# Patient Record
Sex: Female | Born: 1940 | Race: White | Hispanic: No | Smoking: Never smoker
Health system: Southern US, Community
[De-identification: ages and names within clinical notes are randomized; demographics above are authoritative.]

## PROBLEM LIST (undated history)

## (undated) DIAGNOSIS — J309 Allergic rhinitis, unspecified: Secondary | ICD-10-CM

## (undated) DIAGNOSIS — K259 Gastric ulcer, unspecified as acute or chronic, without hemorrhage or perforation: Secondary | ICD-10-CM

## (undated) DIAGNOSIS — G2581 Restless legs syndrome: Secondary | ICD-10-CM

## (undated) DIAGNOSIS — G4730 Sleep apnea, unspecified: Secondary | ICD-10-CM

## (undated) DIAGNOSIS — K219 Gastro-esophageal reflux disease without esophagitis: Secondary | ICD-10-CM

## (undated) DIAGNOSIS — C55 Malignant neoplasm of uterus, part unspecified: Secondary | ICD-10-CM

## (undated) DIAGNOSIS — K644 Residual hemorrhoidal skin tags: Secondary | ICD-10-CM

## (undated) DIAGNOSIS — R600 Localized edema: Secondary | ICD-10-CM

## (undated) DIAGNOSIS — M1990 Unspecified osteoarthritis, unspecified site: Secondary | ICD-10-CM

## (undated) DIAGNOSIS — D649 Anemia, unspecified: Secondary | ICD-10-CM

## (undated) DIAGNOSIS — K317 Polyp of stomach and duodenum: Secondary | ICD-10-CM

## (undated) DIAGNOSIS — M5430 Sciatica, unspecified side: Secondary | ICD-10-CM

## (undated) DIAGNOSIS — E782 Mixed hyperlipidemia: Secondary | ICD-10-CM

## (undated) DIAGNOSIS — M170 Bilateral primary osteoarthritis of knee: Secondary | ICD-10-CM

## (undated) DIAGNOSIS — K2970 Gastritis, unspecified, without bleeding: Secondary | ICD-10-CM

## (undated) DIAGNOSIS — C569 Malignant neoplasm of unspecified ovary: Secondary | ICD-10-CM

## (undated) DIAGNOSIS — I10 Essential (primary) hypertension: Secondary | ICD-10-CM

## (undated) HISTORY — PX: ABDOMINAL HYSTERECTOMY: SHX81

## (undated) HISTORY — PX: HIATAL HERNIA REPAIR: SHX195

## (undated) HISTORY — PX: EYE SURGERY: SHX253

## (undated) HISTORY — PX: DILATION AND CURETTAGE, DIAGNOSTIC / THERAPEUTIC: SUR384

## (undated) HISTORY — PX: CHOLECYSTECTOMY: SHX55

## (undated) HISTORY — PX: TONSILLECTOMY: SUR1361

---

## 2014-08-19 ENCOUNTER — Emergency Department: Payer: Self-pay | Admitting: Emergency Medicine

## 2014-10-19 ENCOUNTER — Ambulatory Visit: Payer: Self-pay | Admitting: Internal Medicine

## 2014-12-21 ENCOUNTER — Ambulatory Visit: Payer: Medicare Other | Admitting: Anesthesiology

## 2014-12-21 ENCOUNTER — Ambulatory Visit
Admission: RE | Admit: 2014-12-21 | Discharge: 2014-12-21 | Disposition: A | Discharge status: Home / Self Care | Payer: Medicare Other | Source: Ambulatory Visit | Attending: Gastroenterology | Admitting: Gastroenterology

## 2014-12-21 ENCOUNTER — Encounter: Payer: Self-pay | Admitting: *Deleted

## 2014-12-21 ENCOUNTER — Encounter
Admission: RE | Disposition: A | Discharge status: Home / Self Care | Payer: Self-pay | Source: Ambulatory Visit | Attending: Gastroenterology

## 2014-12-21 DIAGNOSIS — D122 Benign neoplasm of ascending colon: Secondary | ICD-10-CM | POA: Diagnosis not present

## 2014-12-21 DIAGNOSIS — K449 Diaphragmatic hernia without obstruction or gangrene: Secondary | ICD-10-CM | POA: Diagnosis not present

## 2014-12-21 DIAGNOSIS — K259 Gastric ulcer, unspecified as acute or chronic, without hemorrhage or perforation: Secondary | ICD-10-CM | POA: Insufficient documentation

## 2014-12-21 DIAGNOSIS — K625 Hemorrhage of anus and rectum: Secondary | ICD-10-CM | POA: Insufficient documentation

## 2014-12-21 DIAGNOSIS — Z86010 Personal history of colonic polyps: Secondary | ICD-10-CM | POA: Insufficient documentation

## 2014-12-21 DIAGNOSIS — D509 Iron deficiency anemia, unspecified: Secondary | ICD-10-CM | POA: Insufficient documentation

## 2014-12-21 DIAGNOSIS — D131 Benign neoplasm of stomach: Secondary | ICD-10-CM | POA: Diagnosis not present

## 2014-12-21 DIAGNOSIS — K640 First degree hemorrhoids: Secondary | ICD-10-CM | POA: Insufficient documentation

## 2014-12-21 HISTORY — DX: Essential (primary) hypertension: I10

## 2014-12-21 HISTORY — PX: ESOPHAGOGASTRODUODENOSCOPY: SHX5428

## 2014-12-21 HISTORY — PX: COLONOSCOPY: SHX5424

## 2014-12-21 SURGERY — EGD (ESOPHAGOGASTRODUODENOSCOPY)
Anesthesia: Monitor Anesthesia Care

## 2014-12-21 MED ORDER — SODIUM CHLORIDE 0.9 % IV SOLN
INTRAVENOUS | Status: DC
  Administered 2014-12-21: 10:00:00 via INTRAVENOUS

## 2014-12-21 MED ORDER — LIDOCAINE HCL (CARDIAC) 20 MG/ML IV SOLN: INTRAVENOUS | Status: DC | PRN

## 2014-12-21 MED ORDER — MIDAZOLAM HCL 5 MG/5ML IJ SOLN
INTRAMUSCULAR | Status: DC | PRN
  Administered 2014-12-21: 1 mg via INTRAVENOUS

## 2014-12-21 MED ORDER — MIDAZOLAM HCL 5 MG/5ML IJ SOLN: INTRAMUSCULAR | Status: DC | PRN

## 2014-12-21 MED ORDER — PROPOFOL 10 MG/ML IV BOLUS
INTRAVENOUS | Status: DC | PRN
  Administered 2014-12-21: 80 mg via INTRAVENOUS

## 2014-12-21 MED ORDER — FENTANYL CITRATE (PF) 100 MCG/2ML IJ SOLN
INTRAMUSCULAR | Status: DC | PRN
  Administered 2014-12-21: 1 ug via INTRAVENOUS

## 2014-12-21 MED ORDER — LIDOCAINE HCL (CARDIAC) 20 MG/ML IV SOLN
INTRAVENOUS | Status: DC | PRN
  Administered 2014-12-21: 80 mg via INTRAVENOUS

## 2014-12-21 MED ORDER — SODIUM CHLORIDE 0.9 % IV SOLN
INTRAVENOUS | Status: DC
Start: 2014-12-21 — End: 2014-12-21
  Administered 2014-12-21: 10:00:00 via INTRAVENOUS

## 2014-12-21 MED ORDER — PROPOFOL INFUSION 10 MG/ML OPTIME
INTRAVENOUS | Status: DC | PRN
  Administered 2014-12-21: 180 ug/kg/min via INTRAVENOUS

## 2014-12-21 NOTE — Anesthesia Preprocedure Evaluation (Addendum)
Anesthesia Evaluation  Patient identified by MRN, date of birth, ID band Patient awake    Reviewed: Allergy & Precautions, NPO status , Patient's Chart, lab work & pertinent test results, reviewed documented beta blocker date and time   Airway Mallampati: II  TM Distance: <3 FB Neck ROM: Full    Dental  (+) Upper Dentures, Partial Lower   Pulmonary neg pulmonary ROS,  breath sounds clear to auscultation  Pulmonary exam normal       Cardiovascular hypertension, Pt. on medications and Pt. on home beta blockers Rhythm:Regular Rate:Normal     Neuro/Psych negative neurological ROS  negative psych ROS   GI/Hepatic negative GI ROS, Neg liver ROS, Screening colonoscopy   Endo/Other  negative endocrine ROSMorbid obesity  Renal/GU negative Renal ROS  negative genitourinary   Musculoskeletal negative musculoskeletal ROS (+)   Abdominal (+) + obese,   Peds  Hematology  (+) anemia ,   Anesthesia Other Findings   Reproductive/Obstetrics negative OB ROS                            Anesthesia Physical Anesthesia Plan  ASA: III  Anesthesia Plan: MAC and General   Post-op Pain Management:    Induction: Intravenous  Airway Management Planned: Nasal Cannula  Additional Equipment:   Intra-op Plan:   Post-operative Plan:   Informed Consent: I have reviewed the patients History and Physical, chart, labs and discussed the procedure including the risks, benefits and alternatives for the proposed anesthesia with the patient or authorized representative who has indicated his/her understanding and acceptance.     Plan Discussed with: CRNA and Surgeon  Anesthesia Plan Comments:         Anesthesia Quick Evaluation

## 2014-12-21 NOTE — H&P (Signed)
The recent H&P (dated 12/21/14) was reviewed, the patient was examined and there is no change in the patients condition since that H&P was completed.   Sandra Hubbard, [place]  12/21/2014, 9:32 AM

## 2014-12-21 NOTE — Op Note (Signed)
[facility] Gastroenterology Patient Name: Sandra Hubbard Procedure Date: 12/21/2014 9:17 AM MRN: [id number] Account #: [id number] Date of Birth: [birth year] Admit Type: Outpatient Age: 74 Room: [facility] ENDO ROOM 3 Gender: Female Note Status: Finalized Procedure:         Colonoscopy Indications:       Iron deficiency anemia, personal hx tubal adenoma, virtual                     colonoscopy 6 years ago. Patient Profile:   This is a 74 year old female. Providers:         Gerrit Heck. Rayann Heman, MD Referring MD:      Glendon Axe (Referring MD) Medicines:         Propofol per Anesthesia Complications:     No immediate complications. Procedure:         Pre-Anesthesia Assessment:                    - See the other procedure note for documentation of the                     pre-procedure assessment.                    After obtaining informed consent, the colonoscope was                     passed under direct vision. Throughout the procedure, the                     patient's blood pressure, pulse, and oxygen saturations                     were monitored continuously. The Colonoscope was                     introduced through the anus and advanced to the the                     terminal ileum. The colonoscopy was performed without                     difficulty. The patient tolerated the procedure well. The                     quality of the bowel preparation was good. Findings:      The perianal exam findings include non-thrombosed external hemorrhoids.      A 5 mm polyp was found in the ascending colon. The polyp was sessile.       The polyp was removed with a cold snare. Resection and retrieval were       complete.      Internal hemorrhoids were found during retroflexion. The hemorrhoids       were Grade I (internal hemorrhoids that do not prolapse). Impression:        - Non-thrombosed external hemorrhoids found on perianal                     exam.                    -  One 5 mm polyp in the ascending colon. Resected and                     retrieved.                    -  Internal hemorrhoids. Recommendation:    - Observe patient in GI recovery unit.                    - Continue present medications.                    - Await pathology results.                    - Repeat colonoscopy for surveillance based on pathology                     results, no later than 5 years.                    - Return to referring physician.                    - The findings and recommendations were discussed with the                     patient.                    - The findings and recommendations were discussed with the                     patient's family. Procedure Code(s): --- Professional ---                    [phone], Colonoscopy, flexible; with removal of tumor(s),                     polyp(s), or other lesion(s) by snare technique CPT copyright 2014 American Medical Association. All rights reserved. The codes documented in this report are preliminary and upon coder review may  be revised to meet current compliance requirements. Mellody Life, MD 12/21/2014 10:45:08 AM This report has been signed electronically. Number of Addenda: 0 Note Initiated On: 12/21/2014 9:17 AM Scope Withdrawal Time: 0 hours 18 minutes 2 seconds  Total Procedure Duration: 0 hours 22 minutes 39 seconds       [facility]

## 2014-12-21 NOTE — Op Note (Signed)
[facility] Gastroenterology Patient Name: Eliannah Hinde Procedure Date: 12/21/2014 9:18 AM MRN: [id number] Account #: [id number] Date of Birth: [birth year] Admit Type: Outpatient Age: 74 Room: [facility] ENDO ROOM 3 Gender: Female Note Status: Finalized Procedure:         Upper GI endoscopy Indications:       Iron deficiency anemia Patient Profile:   This is a 74 year old female. Providers:         Gerrit Heck. Rayann Heman, MD Referring MD:      Glendon Axe (Referring MD) Medicines:         Propofol per Anesthesia Complications:     No immediate complications. Procedure:         Pre-Anesthesia Assessment:                    - Prior to the procedure, a History and Physical was                     performed, and patient medications and allergies were                     reviewed. The patient is competent. The risks and benefits                     of the procedure and the sedation options and risks were                     discussed with the patient. All questions were answered                     and informed consent was obtained. Patient identification                     and proposed procedure were verified by the physician and                     the nurse in the pre-procedure area. Mental Status                     Examination: alert and oriented. Airway Examination:                     normal oropharyngeal airway and neck mobility. Respiratory                     Examination: clear to auscultation. CV Examination: RRR,                     no murmurs, no S3 or S4. Prophylactic Antibiotics: The                     patient does not require prophylactic antibiotics. Prior                     Anticoagulants: The patient has taken no previous                     anticoagulant or antiplatelet agents. ASA Grade                     Assessment: III - A patient with severe systemic disease.                     After reviewing the risks and benefits, the patient was  deemed in satisfactory condition to undergo the procedure.                     The anesthesia plan was to use monitored anesthesia care                     (MAC). Immediately prior to administration of medications,                     the patient was re-assessed for adequacy to receive                     sedatives. The heart rate, respiratory rate, oxygen                     saturations, blood pressure, adequacy of pulmonary                     ventilation, and response to care were monitored                     throughout the procedure. The physical status of the                     patient was re-assessed after the procedure.                    - Prior to the procedure, a History and Physical was                     performed, and patient medications, allergies and                     sensitivities were reviewed. The patient's tolerance of                     previous anesthesia was reviewed.                    After obtaining informed consent, the endoscope was passed                     under direct vision. Throughout the procedure, the                     patient's blood pressure, pulse, and oxygen saturations                     were monitored continuously. The Olympus GIF-160 endoscope                     (S#. [id number]) was introduced through the mouth, and                     advanced to the second part of duodenum. The upper GI                     endoscopy was accomplished without difficulty. The patient                     tolerated the procedure well. Findings:      A medium-sized hiatus hernia was present.      A single 4 mm polyp was found at the gastroesophageal junction. Biopsies       were taken with a cold forceps for histology.      Two non-bleeding superficial  gastric ulcers were found in the gastric       antrum. The largest lesion was 4 mm in largest dimension. Biopsies were       taken with a cold forceps for histology.      Diffuse mildly erythematous mucosa was  found in the second part of the       duodenum. Biopsies were taken with a cold forceps for histology.      Five biopsies were obtained with cold forceps for histology randomly in       the gastric body and in the gastric antrum. Impression:        - Medium-sized hiatus hernia.                    - Gastroesophageal junction polyp(s) were found. Biopsied.                    - Gastric ulcers with clean base. Biopsied.                    - Erythematous duodenopathy. Biopsied.                    - Five biopsies were obtained in the gastric body and in                     the gastric antrum. Recommendation:    - Perform a colonoscopy today.                    - Start prilosec 40 mg daily                    - Avoid NSAIDS                    - Treat h pylori if pos.                    - The findings and recommendations were discussed with the                     patient.                    - The findings and recommendations were discussed with the                     patient's family.                    - Await pathology results. Procedure Code(s): --- Professional ---                    [phone], Esophagogastroduodenoscopy, flexible, transoral;                     with biopsy, single or multiple CPT copyright 2014 American Medical Association. All rights reserved. The codes documented in this report are preliminary and upon coder review may  be revised to meet current compliance requirements. Mellody Life, MD 12/21/2014 10:14:46 AM This report has been signed electronically. Number of Addenda: 0 Note Initiated On: 12/21/2014 9:18 AM      [facility]

## 2014-12-21 NOTE — H&P (Signed)
  Primary Care Physician:  Glendon Axe, MD Primary Gastroenterologist:  Dr. Rayann Heman  Pre-Procedure History & Physical: HPI:  Sandra Hubbard is a 74 y.o. female is here for a screening colonoscopy.   Past Medical History  Diagnosis Date  . Hypertension     Past Surgical History  Procedure Laterality Date  . Abdominal hysterectomy      Prior to Admission medications   Medication Sig Start Date End Date Taking? Authorizing Provider  aspirin 81 MG EC tablet Take 81 mg by mouth daily. 11/05/14  Yes Historical Provider, MD  atenolol (TENORMIN) 50 MG tablet Take 50 mg by mouth daily. 10/14/14  Yes Historical Provider, MD  cholecalciferol (VITAMIN D) 400 UNITS TABS tablet Take 2,000 Units by mouth daily.   Yes Historical Provider, MD  co-enzyme Q-10 30 MG capsule Take 30 mg by mouth 2 (two) times daily.   Yes Historical Provider, MD  fluticasone (FLONASE) 50 MCG/ACT nasal spray Place 2 sprays into both nostrils daily. 12/02/14  Yes Historical Provider, MD  ibuprofen (ADVIL,MOTRIN) 600 MG tablet Take 600 mg by mouth 2 (two) times daily as needed. 11/05/14  Yes Historical Provider, MD  KLOR-CON 10 10 MEQ tablet Take 10 mEq by mouth daily. 10/14/14  Yes Historical Provider, MD  lisinopril-hydrochlorothiazide (PRINZIDE,ZESTORETIC) 20-12.5 MG per tablet Take 2 tablets by mouth daily. 10/14/14  Yes Historical Provider, MD  magnesium oxide (MAG-OX) 400 MG tablet Take 400 mg by mouth 2 (two) times daily.   Yes Historical Provider, MD  Multiple Vitamins-Minerals (MULTIVITAMIN WITH MINERALS) tablet Take 2 tablets by mouth every morning.   Yes Historical Provider, MD  Multiple Vitamins-Minerals (MULTIVITAMIN WITH MINERALS) tablet Take 1 tablet by mouth every evening.   Yes Historical Provider, MD  Omega-3 Fatty Acids (OMEGA III EPA+DHA PO) Take 1 tablet by mouth 2 (two) times daily.   Yes Historical Provider, MD  [place] Take 1 each by mouth once. 12/02/14  Yes Historical Provider, MD     Allergies as of 12/03/2014  . (Not on File)     Review of Systems: See HPI, otherwise negative ROS  Physical Exam: BP 197/75 mmHg  Pulse 73  Temp(Src) 98 F (36.7 C) (Tympanic)  Resp 18  Ht 5\' 2"  (1.575 m)  Wt 233 lb (105.688 kg)  BMI 42.61 kg/m2  SpO2 98% General:   Alert,  pleasant and cooperative in NAD Head:  Normocephalic and atraumatic. Neck:  Supple; no masses or thyromegaly. Lungs:  Clear throughout to auscultation.    Heart:  Regular rate and rhythm. Abdomen:  Soft, nontender and nondistended. Normal bowel sounds, without guarding, and without rebound.   Neurologic:  Alert and  oriented x4;  grossly normal neurologically.  Impression/Plan: PERL FOLMAR is now here to undergo a screening colonoscopy.  Risks, benefits, and alternatives regarding colonoscopy have been reviewed with the patient.  Questions have been answered.  All parties agreeable.

## 2014-12-21 NOTE — Transfer of Care (Signed)
Immediate Anesthesia Transfer of Care Note  Patient: Sandra Hubbard  Procedure(s) Performed: Procedure(s): ESOPHAGOGASTRODUODENOSCOPY (EGD) (N/A) COLONOSCOPY (N/A)  Patient Location: PACU  Anesthesia Type:General  Level of Consciousness: awake and alert   Airway & Oxygen Therapy: Patient Spontanous Breathing  Post-op Assessment: Report given to RN  Post vital signs: stable  Last Vitals:  Filed Vitals:   12/21/14 1048  BP: 114/63  Pulse: 60  Temp: 35.8 C  Resp: 12    Complications: No apparent anesthesia complications

## 2014-12-21 NOTE — Anesthesia Postprocedure Evaluation (Signed)
  Anesthesia Post-op Note  Patient: Sandra Hubbard  Procedure(s) Performed: Procedure(s): ESOPHAGOGASTRODUODENOSCOPY (EGD) (N/A) COLONOSCOPY (N/A)  Anesthesia type:MAC, General  Patient location: PACU  Post pain: Pain level controlled  Post assessment: Post-op Vital signs reviewed, Patient's Cardiovascular Status Stable, Respiratory Function Stable, Patent Airway and No signs of Nausea or vomiting  Post vital signs: Reviewed and stable  Last Vitals:  Filed Vitals:   12/21/14 1100  BP: 113/58  Pulse: 65  Temp:   Resp: 14    Level of consciousness: awake, alert  and patient cooperative  Complications: No apparent anesthesia complications

## 2014-12-22 ENCOUNTER — Encounter: Payer: Self-pay | Admitting: *Deleted

## 2014-12-22 LAB — SURGICAL PATHOLOGY

## 2014-12-24 ENCOUNTER — Encounter: Payer: Self-pay | Admitting: Gastroenterology

## 2015-01-25 ENCOUNTER — Encounter: Payer: Self-pay | Admitting: *Deleted

## 2015-01-27 ENCOUNTER — Ambulatory Visit: Payer: Medicare Other | Admitting: Anesthesiology

## 2015-01-27 ENCOUNTER — Ambulatory Visit
Admission: RE | Admit: 2015-01-27 | Discharge: 2015-01-27 | Disposition: A | Discharge status: Home / Self Care | Payer: Medicare Other | Source: Ambulatory Visit | Attending: Gastroenterology | Admitting: Gastroenterology

## 2015-01-27 ENCOUNTER — Encounter
Admission: RE | Disposition: A | Discharge status: Home / Self Care | Payer: Self-pay | Source: Ambulatory Visit | Attending: Gastroenterology

## 2015-01-27 DIAGNOSIS — Z8542 Personal history of malignant neoplasm of other parts of uterus: Secondary | ICD-10-CM | POA: Insufficient documentation

## 2015-01-27 DIAGNOSIS — K2970 Gastritis, unspecified, without bleeding: Secondary | ICD-10-CM | POA: Insufficient documentation

## 2015-01-27 DIAGNOSIS — K449 Diaphragmatic hernia without obstruction or gangrene: Secondary | ICD-10-CM | POA: Diagnosis not present

## 2015-01-27 DIAGNOSIS — M170 Bilateral primary osteoarthritis of knee: Secondary | ICD-10-CM | POA: Insufficient documentation

## 2015-01-27 DIAGNOSIS — Z8543 Personal history of malignant neoplasm of ovary: Secondary | ICD-10-CM | POA: Insufficient documentation

## 2015-01-27 DIAGNOSIS — K259 Gastric ulcer, unspecified as acute or chronic, without hemorrhage or perforation: Secondary | ICD-10-CM | POA: Diagnosis not present

## 2015-01-27 DIAGNOSIS — Z79899 Other long term (current) drug therapy: Secondary | ICD-10-CM | POA: Insufficient documentation

## 2015-01-27 DIAGNOSIS — I10 Essential (primary) hypertension: Secondary | ICD-10-CM | POA: Diagnosis not present

## 2015-01-27 DIAGNOSIS — E782 Mixed hyperlipidemia: Secondary | ICD-10-CM | POA: Insufficient documentation

## 2015-01-27 DIAGNOSIS — D649 Anemia, unspecified: Secondary | ICD-10-CM | POA: Diagnosis not present

## 2015-01-27 DIAGNOSIS — Z90710 Acquired absence of both cervix and uterus: Secondary | ICD-10-CM | POA: Diagnosis not present

## 2015-01-27 DIAGNOSIS — K317 Polyp of stomach and duodenum: Secondary | ICD-10-CM | POA: Insufficient documentation

## 2015-01-27 DIAGNOSIS — M1990 Unspecified osteoarthritis, unspecified site: Secondary | ICD-10-CM | POA: Diagnosis not present

## 2015-01-27 HISTORY — DX: Anemia, unspecified: D64.9

## 2015-01-27 HISTORY — DX: Mixed hyperlipidemia: E78.2

## 2015-01-27 HISTORY — DX: Bilateral primary osteoarthritis of knee: M17.0

## 2015-01-27 HISTORY — DX: Malignant neoplasm of uterus, part unspecified: C55

## 2015-01-27 HISTORY — PX: ESOPHAGOGASTRODUODENOSCOPY: SHX5428

## 2015-01-27 HISTORY — DX: Allergic rhinitis, unspecified: J30.9

## 2015-01-27 HISTORY — DX: Malignant neoplasm of unspecified ovary: C56.9

## 2015-01-27 SURGERY — EGD (ESOPHAGOGASTRODUODENOSCOPY)
Anesthesia: General

## 2015-01-27 MED ORDER — LIDOCAINE HCL (CARDIAC) 20 MG/ML IV SOLN
INTRAVENOUS | Status: DC | PRN
  Administered 2015-01-27: 30 mg via INTRAVENOUS

## 2015-01-27 MED ORDER — MIDAZOLAM HCL 5 MG/5ML IJ SOLN
INTRAMUSCULAR | Status: DC | PRN
  Administered 2015-01-27: 1 mg via INTRAVENOUS

## 2015-01-27 MED ORDER — FENTANYL CITRATE (PF) 100 MCG/2ML IJ SOLN
INTRAMUSCULAR | Status: DC | PRN
  Administered 2015-01-27: 50 ug via INTRAVENOUS

## 2015-01-27 MED ORDER — PROPOFOL INFUSION 10 MG/ML OPTIME
INTRAVENOUS | Status: DC | PRN
  Administered 2015-01-27: 140 ug/kg/min via INTRAVENOUS

## 2015-01-27 MED ORDER — SODIUM CHLORIDE 0.9 % IV SOLN
INTRAVENOUS | Status: DC
  Administered 2015-01-27: 08:00:00 via INTRAVENOUS

## 2015-01-27 MED ORDER — SODIUM CHLORIDE 0.9 % IV SOLN
INTRAVENOUS | Status: DC
  Administered 2015-01-27: 1000 mL via INTRAVENOUS

## 2015-01-27 MED ORDER — PROPOFOL 10 MG/ML IV BOLUS
INTRAVENOUS | Status: DC | PRN
  Administered 2015-01-27: 50 mg via INTRAVENOUS

## 2015-01-27 NOTE — Op Note (Signed)
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 Providers:         Gerrit Heck. Rayann Heman, MD Referring MD:      Glendon Axe (Referring MD) Medicines:         Propofol per Anesthesia Complications:     No immediate complications. Procedure:         Pre-Anesthesia Assessment:                    - Prior to the procedure, a History and Physical was                     performed, and patient medications and allergies were                     reviewed. The patient is competent. The risks and benefits                     of the procedure and the sedation options and risks were                     discussed with the patient. All questions were answered                     and informed consent was obtained. Patient identification                     and proposed procedure were verified by the physician and                     the nurse in the pre-procedure area. Mental Status                     Examination: alert and oriented. Airway Examination:                     normal oropharyngeal airway and neck mobility. Respiratory                     Examination: clear to auscultation. CV Examination: RRR,                     no murmurs, no S3 or S4. Prophylactic Antibiotics: The                     patient does not require prophylactic antibiotics. Prior                     Anticoagulants: The patient has taken no previous                     anticoagulant or antiplatelet agents. ASA Grade  Assessment: III - A  patient with severe systemic disease.                     After reviewing the risks and benefits, the patient was                     deemed in satisfactory condition to undergo the procedure.                     The anesthesia plan was to use monitored anesthesia care                     (MAC). Immediately prior to administration of medications,                     the patient was re-assessed for adequacy to receive                     sedatives. The heart rate, respiratory rate, oxygen                     saturations, blood pressure, adequacy of pulmonary                     ventilation, and response to care were monitored                     throughout the procedure. The physical status of the                     patient was re-assessed after the procedure.                    - Prior to the procedure, a History and Physical was                     performed, and patient medications, allergies and                     sensitivities were reviewed. The patient's tolerance of                     previous anesthesia was reviewed.                    After obtaining informed consent, the endoscope was passed                     under direct vision. Throughout the procedure, the                     patient's blood pressure, pulse, and oxygen saturations                     were monitored continuously. The Olympus GIF-160 endoscope                     (S#. [id number]) was introduced through the mouth, and                     advanced to the second part of duodenum. The upper GI                     endoscopy was accomplished without difficulty. The patient  tolerated the procedure well. Findings:      A single 3 mm polyp was found at the gastroesophageal junction. The       polyp was removed with a cold biopsy forceps. Resection and retrieval       were complete.      A small hiatus hernia was present.      Localized mild inflammation characterized by erythema was found in the        gastric antrum.      A single 4 mm sessile polyp was found in the second part of the       duodenum. The polyp was removed with a cold biopsy forceps. Resection       and retrieval were complete. Impression:        - Gastroesophageal junction polyp(s) were found. Resected                     and retrieved.                    - Small hiatus hernia.                    - Gastritis. Ulcers are healed.                    - A single duodenal polyp. Resected and retrieved. Recommendation:    - Observe patient in GI recovery unit.                    - Resume regular diet.                    - Continue present medications.                    - Await pathology results.                    - Repeat the upper endoscopy in 2 years for surveillance                     to f/u squamous papilloma                    - Return to referring physician.                    - The findings and recommendations were discussed with the                     patient.                    - The findings and recommendations were discussed with the                     patient's family. Procedure Code(s): --- Professional ---                    [phone], Esophagogastroduodenoscopy, flexible, transoral;                     with biopsy, single or multiple CPT copyright 2014 American Medical Association. All rights reserved. The codes documented in this report are preliminary and upon coder review may  be revised to meet current compliance requirements. Mellody Life, MD 01/27/2015 8:41:52 AM This report has been signed electronically. Number of Addenda: 0 Note Initiated On: 01/27/2015 7:36  [facility]

## 2015-01-27 NOTE — Discharge Instructions (Signed)

## 2015-01-27 NOTE — Anesthesia Postprocedure Evaluation (Signed)
  Anesthesia Post-op Note  Patient: Sandra Hubbard  Procedure(s) Performed: Procedure(s): ESOPHAGOGASTRODUODENOSCOPY (EGD) (N/A)  Anesthesia type:General  Patient location: PACU  Post pain: Pain level controlled  Post assessment: Post-op Vital signs reviewed, Patient's Cardiovascular Status Stable, Respiratory Function Stable, Patent Airway and No signs of Nausea or vomiting  Post vital signs: Reviewed and stable  Last Vitals:  Filed Vitals:   01/27/15 0900  BP: 114/61  Pulse: 57  Temp:   Resp: 15    Level of consciousness: awake, alert  and patient cooperative  Complications: No apparent anesthesia complications

## 2015-01-27 NOTE — Transfer of Care (Signed)
Immediate Anesthesia Transfer of Care Note  Patient: Sandra Hubbard  Procedure(s) Performed: Procedure(s): ESOPHAGOGASTRODUODENOSCOPY (EGD) (N/A)  Patient Location: PACU  Anesthesia Type:General  Level of Consciousness: awake  Airway & Oxygen Therapy: Patient Spontanous Breathing  Post-op Assessment: Report given to RN  Post vital signs: Reviewed and stable  Last Vitals:  Filed Vitals:   01/27/15 0845  BP: 106/66  Pulse: 60  Temp: 36 C  Resp:     Complications: No apparent anesthesia complications

## 2015-01-27 NOTE — Anesthesia Postprocedure Evaluation (Addendum)
  Anesthesia Post-op Note  Patient: Sandra Hubbard  Procedure(s) Performed: Procedure(s): ESOPHAGOGASTRODUODENOSCOPY (EGD) (N/A)  Anesthesia type:General  Patient location: PACU  Post pain: Pain level controlled  Post assessment: Post-op Vital signs reviewed, Patient's Cardiovascular Status Stable, Respiratory Function Stable, Patent Airway and No signs of Nausea or vomiting  Post vital signs: Reviewed and stable  Last Vitals:  Filed Vitals:   01/27/15 0845  BP: 106/66  Pulse: 60  Temp: 36 C  Resp:     Level of consciousness: awake, alert  and patient cooperative  Complications: No apparent anesthesia complications

## 2015-01-27 NOTE — H&P (Signed)
Primary Care Physician:  Glendon Axe, MD  Pre-Procedure History & Physical: HPI:  Sandra Hubbard is a 75 y.o. female is here for an endoscopy.   Past Medical History  Diagnosis Date  . Hypertension   . Osteoarthritis of both knees   . Osteoarthritis of both knees   . Rhinitis, allergic   . Hyperlipidemia, mixed   . Chronic anemia   . Ovarian cancer   . Ovarian cancer   . Uterine cancer     Past Surgical History  Procedure Laterality Date  . Abdominal hysterectomy    . Esophagogastroduodenoscopy N/A 12/21/2014    Procedure: ESOPHAGOGASTRODUODENOSCOPY (EGD);  Surgeon: Josefine Class, MD;  Location: [facility] ENDOSCOPY;  Service: Endoscopy;  Laterality: N/A;  . Colonoscopy N/A 12/21/2014    Procedure: COLONOSCOPY;  Surgeon: Josefine Class, MD;  Location: [facility] ENDOSCOPY;  Service: Endoscopy;  Laterality: N/A;    Prior to Admission medications   Medication Sig Start Date End Date Taking? Authorizing Provider  acetaminophen (TYLENOL) 650 MG CR tablet Take 1,300 mg by mouth every 8 (eight) hours as needed for pain.   Yes Historical Provider, MD  aspirin 81 MG EC tablet Take 81 mg by mouth daily. 11/05/14  Yes Historical Provider, MD  atenolol (TENORMIN) 50 MG tablet Take 50 mg by mouth daily. 10/14/14  Yes Historical Provider, MD  cetirizine (ZYRTEC) 10 MG tablet Take 10 mg by mouth daily.   Yes Historical Provider, MD  Cholecalciferol (VITAMIN D) 2000 UNITS CAPS Take 1 capsule by mouth 2 (two) times daily.   Yes Historical Provider, MD  co-enzyme Q-10 30 MG capsule Take 30 mg by mouth daily.    Yes Historical Provider, MD  ferrous sulfate 325 (65 FE) MG tablet Take 325 mg by mouth daily with breakfast.   Yes Historical Provider, MD  fexofenadine (ALLEGRA) 180 MG tablet Take 180 mg by mouth daily.   Yes Historical Provider, MD  fluticasone (FLONASE) 50 MCG/ACT nasal spray Place 2 sprays into both nostrils daily. 12/02/14  Yes Historical Provider, MD  furosemide (LASIX) 40 MG tablet  Take 40 mg by mouth daily.   Yes Historical Provider, MD  glucosamine-chondroitin 500-400 MG tablet Take 2 tablets by mouth 2 (two) times daily.   Yes Historical Provider, MD  KLOR-CON 10 10 MEQ tablet Take 10 mEq by mouth daily. 10/14/14  Yes Historical Provider, MD  lisinopril-hydrochlorothiazide (PRINZIDE,ZESTORETIC) 20-12.5 MG per tablet Take 2 tablets by mouth daily. 10/14/14  Yes Historical Provider, MD  magnesium oxide (MAG-OX) 400 MG tablet Take 400 mg by mouth 2 (two) times daily.   Yes Historical Provider, MD  Misc Natural Products (LEG VEIN & CIRCULATION PO) Take 1 tablet by mouth 2 (two) times daily.   Yes Historical Provider, MD  Multiple Vitamins-Minerals (MULTIVITAMIN WITH MINERALS) tablet Take 2-3 tablets by mouth 2 (two) times daily. 3 tab in the morning and 2 in the afternoon   Yes Historical Provider, MD  Omega-3 Fatty Acids (OMEGA III EPA+DHA PO) Take 1 tablet by mouth 2 (two) times daily.   Yes Historical Provider, MD  omeprazole (PRILOSEC) 40 MG capsule Take 40 mg by mouth daily.   Yes Historical Provider, MD  potassium chloride (K-DUR,KLOR-CON) 10 MEQ tablet Take 10 mEq by mouth daily.   Yes Historical Provider, MD  [place] Take 1 each by mouth once. 12/02/14  Yes Historical Provider, MD    Allergies as of 01/12/2015 - Review Complete 12/21/2014  Allergen Reaction Noted  . Maxzide [hydrochlorothiazide  w-triamterene] Other (See Comments) 12/15/2014  . Donnatal [pb-hyoscy-atropine-scopolamine] Itching and Rash 12/21/2014    History reviewed. No pertinent family history.  History   Social History  . Marital Status: Widowed    Spouse Name: N/A  . Number of Children: N/A  . Years of Education: N/A   Occupational History  . Not on file.   Social History Main Topics  . Smoking status: Never Smoker   . Smokeless tobacco: Not on file  . Alcohol Use: No  . Drug Use: No  . Sexual Activity: Not on file   Other Topics Concern  . Not on file   Social  History Narrative     Physical Exam: BP 142/73 mmHg  Pulse 78  Temp(Src) 97.3 F (36.3 C) (Tympanic)  Resp 20  Ht 5\' 2"  (1.575 m)  Wt 235 lb (106.595 kg)  BMI 42.97 kg/m2  SpO2 98% General:   Alert,  pleasant and cooperative in NAD Head:  Normocephalic and atraumatic. Neck:  Supple; no masses or thyromegaly. Lungs:  Clear throughout to auscultation.    Heart:  Regular rate and rhythm. Abdomen:  Soft, nontender and nondistended. Normal bowel sounds, without guarding, and without rebound.   Neurologic:  Alert and  oriented x4;  grossly normal neurologically.  Impression/Plan: Sandra Hubbard is here for an endoscopy to be performed for removal of squamous papilloma, f/u gastric ulcers  Risks, benefits, limitations, and alternatives regarding  endoscopy have been reviewed with the patient.  Questions have been answered.  All parties agreeable.   Josefine Class, MD  01/27/2015, 8:15 AM

## 2015-01-27 NOTE — Anesthesia Preprocedure Evaluation (Addendum)
Anesthesia Evaluation  Patient identified by MRN, date of birth, ID band Patient awake    Reviewed: Allergy & Precautions, NPO status , Patient's Chart, lab work & pertinent test results  History of Anesthesia Complications Negative for: history of anesthetic complications  Airway Mallampati: II  TM Distance: >3 FB Neck ROM: Full    Dental  (+) Upper Dentures, Partial Lower   Pulmonary neg pulmonary ROS,  breath sounds clear to auscultation  Pulmonary exam normal       Cardiovascular Exercise Tolerance: Poor hypertension, Pt. on medications Normal cardiovascular examRhythm:Regular Rate:Normal     Neuro/Psych negative neurological ROS  negative psych ROS   GI/Hepatic negative GI ROS, Neg liver ROS,   Endo/Other  negative endocrine ROS  Renal/GU negative Renal ROS  negative genitourinary   Musculoskeletal  (+) Arthritis -,   Abdominal   Peds negative pediatric ROS (+)  Hematology  (+) anemia ,   Anesthesia Other Findings   Reproductive/Obstetrics negative OB ROS                            Anesthesia Physical Anesthesia Plan  ASA: III  Anesthesia Plan: General   Post-op Pain Management:    Induction: Intravenous  Airway Management Planned: Nasal Cannula  Additional Equipment:   Intra-op Plan:   Post-operative Plan:   Informed Consent: I have reviewed the patients History and Physical, chart, labs and discussed the procedure including the risks, benefits and alternatives for the proposed anesthesia with the patient or authorized representative who has indicated his/her understanding and acceptance.     Plan Discussed with: CRNA and Surgeon  Anesthesia Plan Comments:         Anesthesia Quick Evaluation

## 2015-01-28 ENCOUNTER — Encounter: Payer: Self-pay | Admitting: Gastroenterology

## 2015-02-03 LAB — SURGICAL PATHOLOGY

## 2016-10-01 ENCOUNTER — Other Ambulatory Visit: Payer: Self-pay | Admitting: Physical Medicine and Rehabilitation

## 2016-10-01 DIAGNOSIS — M5136 Other intervertebral disc degeneration, lumbar region: Secondary | ICD-10-CM

## 2016-10-10 ENCOUNTER — Ambulatory Visit
Admission: RE | Admit: 2016-10-10 | Discharge: 2016-10-10 | Disposition: A | Discharge status: Home / Self Care | Payer: Medicare Other | Source: Ambulatory Visit | Attending: Physical Medicine and Rehabilitation | Admitting: Physical Medicine and Rehabilitation

## 2016-10-10 DIAGNOSIS — M4306 Spondylolysis, lumbar region: Secondary | ICD-10-CM | POA: Insufficient documentation

## 2016-10-10 DIAGNOSIS — M5126 Other intervertebral disc displacement, lumbar region: Secondary | ICD-10-CM | POA: Insufficient documentation

## 2016-10-10 DIAGNOSIS — M4807 Spinal stenosis, lumbosacral region: Secondary | ICD-10-CM | POA: Diagnosis not present

## 2016-10-10 DIAGNOSIS — M2578 Osteophyte, vertebrae: Secondary | ICD-10-CM | POA: Diagnosis not present

## 2016-10-10 DIAGNOSIS — M5125 Other intervertebral disc displacement, thoracolumbar region: Secondary | ICD-10-CM | POA: Insufficient documentation

## 2016-10-10 DIAGNOSIS — M48061 Spinal stenosis, lumbar region without neurogenic claudication: Secondary | ICD-10-CM | POA: Diagnosis not present

## 2016-10-10 DIAGNOSIS — M5136 Other intervertebral disc degeneration, lumbar region: Secondary | ICD-10-CM | POA: Insufficient documentation

## 2016-10-27 DIAGNOSIS — M51369 Other intervertebral disc degeneration, lumbar region without mention of lumbar back pain or lower extremity pain: Secondary | ICD-10-CM | POA: Diagnosis present

## 2017-02-01 ENCOUNTER — Other Ambulatory Visit: Payer: Self-pay | Admitting: Internal Medicine

## 2017-02-01 DIAGNOSIS — Z1231 Encounter for screening mammogram for malignant neoplasm of breast: Secondary | ICD-10-CM

## 2017-02-12 ENCOUNTER — Ambulatory Visit
Admission: RE | Admit: 2017-02-12 | Discharge: 2017-02-12 | Disposition: A | Discharge status: Home / Self Care | Payer: Medicare Other | Source: Ambulatory Visit | Attending: Internal Medicine | Admitting: Internal Medicine

## 2017-02-12 DIAGNOSIS — Z1231 Encounter for screening mammogram for malignant neoplasm of breast: Secondary | ICD-10-CM | POA: Diagnosis not present

## 2017-02-25 ENCOUNTER — Other Ambulatory Visit: Payer: Self-pay | Admitting: Internal Medicine

## 2017-02-25 DIAGNOSIS — N6489 Other specified disorders of breast: Secondary | ICD-10-CM

## 2017-02-25 DIAGNOSIS — R928 Other abnormal and inconclusive findings on diagnostic imaging of breast: Secondary | ICD-10-CM

## 2017-02-28 ENCOUNTER — Ambulatory Visit
Admission: RE | Admit: 2017-02-28 | Discharge: 2017-02-28 | Disposition: A | Discharge status: Home / Self Care | Payer: Medicare Other | Source: Ambulatory Visit | Attending: Internal Medicine | Admitting: Internal Medicine

## 2017-02-28 DIAGNOSIS — R928 Other abnormal and inconclusive findings on diagnostic imaging of breast: Secondary | ICD-10-CM

## 2017-02-28 DIAGNOSIS — N6489 Other specified disorders of breast: Secondary | ICD-10-CM | POA: Insufficient documentation

## 2017-03-12 ENCOUNTER — Inpatient Hospital Stay
Admission: RE | Admit: 2017-03-12 | Discharge: 2017-03-12 | Disposition: A | Discharge status: Home / Self Care | Payer: Self-pay | Source: Ambulatory Visit | Attending: *Deleted | Admitting: *Deleted

## 2017-03-12 ENCOUNTER — Other Ambulatory Visit: Payer: Self-pay | Admitting: *Deleted

## 2017-03-12 DIAGNOSIS — Z9289 Personal history of other medical treatment: Secondary | ICD-10-CM

## 2017-03-26 ENCOUNTER — Other Ambulatory Visit: Payer: Self-pay | Admitting: Internal Medicine

## 2017-03-26 DIAGNOSIS — N6320 Unspecified lump in the left breast, unspecified quadrant: Secondary | ICD-10-CM

## 2017-03-26 DIAGNOSIS — R928 Other abnormal and inconclusive findings on diagnostic imaging of breast: Secondary | ICD-10-CM

## 2017-04-02 ENCOUNTER — Ambulatory Visit
Admission: RE | Admit: 2017-04-02 | Discharge: 2017-04-02 | Disposition: A | Discharge status: Home / Self Care | Payer: Medicare Other | Source: Ambulatory Visit | Attending: Internal Medicine | Admitting: Internal Medicine

## 2017-04-02 DIAGNOSIS — N6032 Fibrosclerosis of left breast: Secondary | ICD-10-CM | POA: Diagnosis not present

## 2017-04-02 DIAGNOSIS — R928 Other abnormal and inconclusive findings on diagnostic imaging of breast: Secondary | ICD-10-CM

## 2017-04-02 DIAGNOSIS — N6320 Unspecified lump in the left breast, unspecified quadrant: Secondary | ICD-10-CM | POA: Diagnosis present

## 2017-04-02 DIAGNOSIS — N6321 Unspecified lump in the left breast, upper outer quadrant: Secondary | ICD-10-CM | POA: Diagnosis not present

## 2017-04-02 HISTORY — PX: BREAST BIOPSY: SHX20

## 2017-04-03 LAB — SURGICAL PATHOLOGY

## 2017-06-06 ENCOUNTER — Encounter: Payer: Self-pay | Admitting: *Deleted

## 2017-06-07 ENCOUNTER — Ambulatory Visit: Payer: Medicare Other | Admitting: Anesthesiology

## 2017-06-07 ENCOUNTER — Ambulatory Visit
Admission: RE | Admit: 2017-06-07 | Discharge: 2017-06-07 | Disposition: A | Discharge status: Home / Self Care | Payer: Medicare Other | Source: Ambulatory Visit | Attending: Internal Medicine | Admitting: Internal Medicine

## 2017-06-07 ENCOUNTER — Encounter: Payer: Self-pay | Admitting: *Deleted

## 2017-06-07 ENCOUNTER — Encounter
Admission: RE | Disposition: A | Discharge status: Home / Self Care | Payer: Self-pay | Source: Ambulatory Visit | Attending: Internal Medicine

## 2017-06-07 DIAGNOSIS — G2581 Restless legs syndrome: Secondary | ICD-10-CM | POA: Diagnosis not present

## 2017-06-07 DIAGNOSIS — Z79899 Other long term (current) drug therapy: Secondary | ICD-10-CM | POA: Diagnosis not present

## 2017-06-07 DIAGNOSIS — Z7982 Long term (current) use of aspirin: Secondary | ICD-10-CM | POA: Insufficient documentation

## 2017-06-07 DIAGNOSIS — I10 Essential (primary) hypertension: Secondary | ICD-10-CM | POA: Diagnosis not present

## 2017-06-07 DIAGNOSIS — Z8542 Personal history of malignant neoplasm of other parts of uterus: Secondary | ICD-10-CM | POA: Insufficient documentation

## 2017-06-07 DIAGNOSIS — E669 Obesity, unspecified: Secondary | ICD-10-CM | POA: Diagnosis not present

## 2017-06-07 DIAGNOSIS — E782 Mixed hyperlipidemia: Secondary | ICD-10-CM | POA: Insufficient documentation

## 2017-06-07 DIAGNOSIS — Z6841 Body Mass Index (BMI) 40.0 and over, adult: Secondary | ICD-10-CM | POA: Diagnosis not present

## 2017-06-07 DIAGNOSIS — Z8543 Personal history of malignant neoplasm of ovary: Secondary | ICD-10-CM | POA: Diagnosis not present

## 2017-06-07 DIAGNOSIS — M170 Bilateral primary osteoarthritis of knee: Secondary | ICD-10-CM | POA: Insufficient documentation

## 2017-06-07 DIAGNOSIS — K3189 Other diseases of stomach and duodenum: Secondary | ICD-10-CM | POA: Diagnosis not present

## 2017-06-07 DIAGNOSIS — K219 Gastro-esophageal reflux disease without esophagitis: Secondary | ICD-10-CM | POA: Insufficient documentation

## 2017-06-07 DIAGNOSIS — Z888 Allergy status to other drugs, medicaments and biological substances status: Secondary | ICD-10-CM | POA: Insufficient documentation

## 2017-06-07 HISTORY — DX: Sciatica, unspecified side: M54.30

## 2017-06-07 HISTORY — PX: ESOPHAGOGASTRODUODENOSCOPY (EGD) WITH PROPOFOL: SHX5813

## 2017-06-07 HISTORY — DX: Unspecified osteoarthritis, unspecified site: M19.90

## 2017-06-07 HISTORY — DX: Restless legs syndrome: G25.81

## 2017-06-07 HISTORY — DX: Localized edema: R60.0

## 2017-06-07 SURGERY — ESOPHAGOGASTRODUODENOSCOPY (EGD) WITH PROPOFOL
Anesthesia: General

## 2017-06-07 MED ORDER — SODIUM CHLORIDE 0.9 % IV SOLN
INTRAVENOUS | Status: DC
  Administered 2017-06-07: 13:00:00 via INTRAVENOUS

## 2017-06-07 MED ORDER — LIDOCAINE HCL (PF) 2 % IJ SOLN
INTRAMUSCULAR | Status: DC | PRN
  Administered 2017-06-07: 50 mg via INTRADERMAL

## 2017-06-07 MED ORDER — PROPOFOL 10 MG/ML IV BOLUS
INTRAVENOUS | Status: DC | PRN
  Administered 2017-06-07: 60 mg via INTRAVENOUS
  Administered 2017-06-07: 20 mg via INTRAVENOUS

## 2017-06-07 MED ORDER — PROPOFOL 500 MG/50ML IV EMUL
INTRAVENOUS | Status: DC | PRN
  Administered 2017-06-07: 120 ug/kg/min via INTRAVENOUS

## 2017-06-07 MED ORDER — SODIUM CHLORIDE 0.9 % IV SOLN
INTRAVENOUS | Status: DC | PRN
  Administered 2017-06-07: 13:00:00 via INTRAVENOUS

## 2017-06-07 MED ORDER — PROPOFOL 10 MG/ML IV BOLUS
INTRAVENOUS | Status: AC
  Filled 2017-06-07: qty 20

## 2017-06-07 NOTE — Anesthesia Postprocedure Evaluation (Signed)
Anesthesia Post Note  Patient: Sandra Hubbard  Procedure(s) Performed: ESOPHAGOGASTRODUODENOSCOPY (EGD) WITH PROPOFOL (N/A )  Patient location during evaluation: Endoscopy Anesthesia Type: General Level of consciousness: awake and alert and oriented Pain management: pain level controlled Vital Signs Assessment: post-procedure vital signs reviewed and stable Respiratory status: spontaneous breathing, nonlabored ventilation and respiratory function stable Cardiovascular status: blood pressure returned to baseline and stable Postop Assessment: no signs of nausea or vomiting Anesthetic complications: no     Last Vitals:  Vitals:   06/07/17 1402 06/07/17 1412  BP: (!) 118/54 (!) 141/64  Pulse: 78 64  Resp: 20 18  Temp: (!) 36.2 C   SpO2: 100% 100%    Last Pain:  Vitals:   06/07/17 1402  TempSrc: Tympanic                 Adan Beal

## 2017-06-07 NOTE — Anesthesia Preprocedure Evaluation (Signed)
Anesthesia Evaluation  Patient identified by MRN, date of birth, ID band Patient awake    Reviewed: Allergy & Precautions, NPO status , Patient's Chart, lab work & pertinent test results  History of Anesthesia Complications Negative for: history of anesthetic complications  Airway Mallampati: II  TM Distance: >3 FB Neck ROM: Full    Dental  (+) Upper Dentures, Partial Lower   Pulmonary neg pulmonary ROS, neg sleep apnea, neg COPD,    breath sounds clear to auscultation- rhonchi (-) wheezing      Cardiovascular hypertension, Pt. on medications (-) CAD, (-) Past MI and (-) Cardiac Stents  Rhythm:Regular Rate:Normal - Systolic murmurs and - Diastolic murmurs    Neuro/Psych negative neurological ROS  negative psych ROS   GI/Hepatic negative GI ROS, Neg liver ROS,   Endo/Other  negative endocrine ROSneg diabetes  Renal/GU negative Renal ROS     Musculoskeletal  (+) Arthritis ,   Abdominal (+) + obese,   Peds  Hematology  (+) anemia ,   Anesthesia Other Findings Past Medical History: No date: Chronic anemia No date: DJD (degenerative joint disease) No date: Edema of both legs No date: Hyperlipidemia, mixed No date: Hypertension No date: Osteoarthritis of both knees No date: Osteoarthritis of both knees No date: Ovarian cancer (HCC) No date: Ovarian cancer (HCC) No date: Rhinitis, allergic No date: RLS (restless legs syndrome) No date: Sciatica No date: Uterine cancer (HCC) No date: Uterine cancer ([place])   Reproductive/Obstetrics                             Anesthesia Physical Anesthesia Plan  ASA: III  Anesthesia Plan: General   Post-op Pain Management:    Induction: Intravenous  PONV Risk Score and Plan: 2 and Propofol infusion  Airway Management Planned: Natural Airway  Additional Equipment:   Intra-op Plan:   Post-operative Plan:   Informed Consent: I have reviewed  the patients History and Physical, chart, labs and discussed the procedure including the risks, benefits and alternatives for the proposed anesthesia with the patient or authorized representative who has indicated his/her understanding and acceptance.   Dental advisory given  Plan Discussed with: CRNA and Anesthesiologist  Anesthesia Plan Comments:         Anesthesia Quick Evaluation

## 2017-06-07 NOTE — Transfer of Care (Signed)
Immediate Anesthesia Transfer of Care Note  Patient: Sandra Hubbard  Procedure(s) Performed: ESOPHAGOGASTRODUODENOSCOPY (EGD) WITH PROPOFOL (N/A )  Patient Location: Endoscopy Unit  Anesthesia Type:General  Level of Consciousness: awake and alert   Airway & Oxygen Therapy: Patient Spontanous Breathing  Post-op Assessment: Report given to RN and Post -op Vital signs reviewed and stable  Post vital signs: Reviewed and stable  Last Vitals:  Vitals:   06/07/17 1258  BP: 140/64  Pulse: 79  Resp: 16  Temp: 37.1 C  SpO2: 99%    Last Pain:  Vitals:   06/07/17 1258  TempSrc: Tympanic      Patients Stated Pain Goal: 0 (05/39/76 7341)  Complications: No apparent anesthesia complications

## 2017-06-07 NOTE — Op Note (Signed)
[facility] Gastroenterology Patient Name: Sandra Hubbard Procedure Date: 06/07/2017 1:42 PM MRN: [id number] Account #: [id number] Date of Birth: 1941/07/22 Admit Type: Outpatient Age: 76 Room: [facility] ENDO ROOM 4 Gender: Female Note Status: Finalized Procedure:            Upper GI endoscopy Indications:          Esophageal reflux, Follow-up of benign esophageal tumor Providers:            Benay Pike. Alice Reichert MD, MD Referring MD:         Glendon Axe (Referring MD) Medicines:            Propofol per Anesthesia Complications:        No immediate complications. Procedure:            Pre-Anesthesia Assessment:                       - The risks and benefits of the procedure and the                        sedation options and risks were discussed with the                        patient. All questions were answered and informed                        consent was obtained.                       - Patient identification and proposed procedure were                        verified prior to the procedure by the nurse. The                        procedure was verified in the endoscopy suite.                       - ASA Grade Assessment: III - A patient with severe                        systemic disease.                       - After reviewing the risks and benefits, the patient                        was deemed in satisfactory condition to undergo the                        procedure.                       After obtaining informed consent, the endoscope was                        passed under direct vision. Throughout the procedure,                        the patient's blood pressure, pulse, and oxygen  saturations were monitored continuously. The                        Colonoscope was introduced through the mouth, and                        advanced to the third part of duodenum. The upper GI                        endoscopy was accomplished without  difficulty. The                        patient tolerated the procedure well. Findings:      White nummular lesions were noted in the entire esophagus. 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 Estimated blood loss       was minimal. I was unable to identify the polyp noted on a past       endoscopy.      Localized mildly erythematous mucosa without bleeding was found in the       gastric antrum. No biopsies or other specimens were collected for this       exam.      The examined duodenum was normal. Impression:           - White nummular lesions in esophageal mucosa. Biopsied.                       - Erythematous mucosa in the antrum. No specimens                        collected.                       - Normal examined duodenum. Recommendation:       - Patient has a contact number available for                        emergencies. The signs and symptoms of potential                        delayed complications were discussed with the patient.                        Return to normal activities tomorrow. Written discharge                        instructions were provided to the patient.                       - Resume previous diet.                       - Continue present medications.                       - Await pathology results.                       - No repeat upper endoscopy.                       -  Return to GI office in 1 year.                       - The findings and recommendations were discussed with                        the patient.                       - The findings and recommendations were discussed with                        the designated responsible adult. Procedure Code(s):    --- Professional ---                       [phone], Esophagogastroduodenoscopy, flexible, transoral;                        with biopsy, single or multiple Diagnosis Code(s):    ---  Professional ---                       K22.8, Other specified diseases of esophagus                       K31.89, Other diseases of stomach and duodenum                       K21.9, Gastro-esophageal reflux disease without                        esophagitis                       D13.0, Benign neoplasm of esophagus CPT copyright 2016 American Medical Association. All rights reserved. The codes documented in this report are preliminary and upon coder review may  be revised to meet current compliance requirements. Efrain Sella MD, MD 06/07/2017 2:03:44 PM This report has been signed electronically. Number of Addenda: 0 Note Initiated On: 06/07/2017 1:42 PM      [facility]

## 2017-06-07 NOTE — Anesthesia Post-op Follow-up Note (Signed)
Anesthesia QCDR form completed.        

## 2017-06-07 NOTE — H&P (Signed)
Outpatient short stay form Pre-procedure 06/07/2017 1:34 PM Teodoro K. Alice Reichert, M.D.  Primary Physician: Glendon Axe, M.D.  Reason for visit:  Esophageal polyp, GERD  History of present illness:  Patient with well controlled GERD. Has hx of esophageal polyp, benign biopsy. Here for f/u of lesion. No dysphagia.    Current Facility-Administered Medications:  .  0.9 %  sodium chloride infusion, , Intravenous, Continuous, [place], Benay Pike, MD, Last Rate: 20 mL/hr at 06/07/17 1316  Facility-Administered Medications Ordered in Other Encounters:  .  0.9 %  sodium chloride infusion, , Intravenous, Continuous PRN, [place], Amy, MD  Prescriptions Prior to Admission  Medication Sig Dispense Refill Last Dose  . aspirin 81 MG EC tablet Take 81 mg by mouth daily.  3 06/06/2017 at Unknown time  . atenolol (TENORMIN) 50 MG tablet Take 50 mg by mouth daily.  3 06/06/2017 at Unknown time  . cetirizine (ZYRTEC) 10 MG tablet Take 10 mg by mouth daily.   06/06/2017 at Unknown time  . Cholecalciferol (VITAMIN D) 2000 UNITS CAPS Take 1 capsule by mouth 2 (two) times daily.   06/06/2017 at Unknown time  . co-enzyme Q-10 30 MG capsule Take 30 mg by mouth daily.    06/06/2017 at Unknown time  . ferrous sulfate 325 (65 FE) MG tablet Take 325 mg by mouth daily with breakfast.   06/06/2017 at Unknown time  . fexofenadine (ALLEGRA) 180 MG tablet Take 180 mg by mouth daily.   06/06/2017 at Unknown time  . fluticasone (FLONASE) 50 MCG/ACT nasal spray Place 2 sprays into both nostrils daily.  12 06/07/2017 at Unknown time  . furosemide (LASIX) 40 MG tablet Take 40 mg by mouth daily.   06/06/2017 at Unknown time  . KLOR-CON 10 10 MEQ tablet Take 10 mEq by mouth daily.  3 06/06/2017 at Unknown time  . lisinopril-hydrochlorothiazide (PRINZIDE,ZESTORETIC) 20-12.5 MG per tablet Take 2 tablets by mouth daily.  3 06/06/2017 at Unknown time  . Lorcaserin HCl 10 MG TABS Take 10 mg by mouth.     . magnesium oxide (MAG-OX)  400 MG tablet Take 400 mg by mouth 2 (two) times daily.   06/06/2017 at Unknown time  . Multiple Vitamins-Minerals (MULTIVITAMIN WITH MINERALS) tablet Take 2-3 tablets by mouth 2 (two) times daily. 3 tab in the morning and 2 in the afternoon   06/06/2017 at Unknown time  . Omega-3 Fatty Acids (OMEGA III EPA+DHA PO) Take 1 tablet by mouth 2 (two) times daily.   06/06/2017 at Unknown time  . omeprazole (PRILOSEC) 40 MG capsule Take 40 mg by mouth daily.   06/06/2017 at Unknown time  . traMADol (ULTRAM) 50 MG tablet Take by mouth every 6 (six) hours as needed.   06/06/2017 at Unknown time  . acetaminophen (TYLENOL) 650 MG CR tablet Take 1,300 mg by mouth every 8 (eight) hours as needed for pain.     Marland Kitchen glucosamine-chondroitin 500-400 MG tablet Take 2 tablets by mouth 2 (two) times daily.     . Misc Natural Products (LEG VEIN & CIRCULATION PO) Take 1 tablet by mouth 2 (two) times daily.     . potassium chloride (K-DUR,KLOR-CON) 10 MEQ tablet Take 10 mEq by mouth daily.   01/27/2015 at 0600  . SUPREP BOWEL PREP SOLN Take 1 each by mouth once.  0 12/21/2014 at 0315     Allergies  Allergen Reactions  . Maxzide [Hydrochlorothiazide W-Triamterene] Other (See Comments)    unk  . Donnatal [Pb-Hyoscy-Atropine-Scopolamine] Itching and Rash  Past Medical History:  Diagnosis Date  . Chronic anemia   . DJD (degenerative joint disease)   . Edema of both legs   . Hyperlipidemia, mixed   . Hypertension   . Osteoarthritis of both knees   . Osteoarthritis of both knees   . Ovarian cancer ([place])   . Ovarian cancer ([place])   . Rhinitis, allergic   . RLS (restless legs syndrome)   . Sciatica   . Uterine cancer ([place])   . Uterine cancer ([place])     Review of systems:      Physical Exam  General appearance: alert, cooperative and appears stated age Resp: clear to auscultation bilaterally Cardio: regular rate and rhythm, S1, S2 normal, no murmur, click, rub or gallop GI: soft, non-tender; bowel sounds  normal; no masses,  no organomegaly     Planned procedures: EGD. The patient understands the nature of the planned procedure, indications, risks, alternatives and potential complications including but not limited to bleeding, infection, perforation, damage to internal organs and possible oversedation/side effects from anesthesia. The patient agrees and gives consent to proceed.  Please refer to procedure notes for findings, recommendations and patient disposition/instructions.    Teodoro K. Alice Reichert, M.D. Gastroenterology 06/07/2017  1:34 PM

## 2017-06-11 ENCOUNTER — Encounter: Payer: Self-pay | Admitting: Internal Medicine

## 2017-06-11 LAB — SURGICAL PATHOLOGY

## 2017-12-24 ENCOUNTER — Encounter (INDEPENDENT_AMBULATORY_CARE_PROVIDER_SITE_OTHER): Payer: Self-pay | Admitting: Vascular Surgery

## 2017-12-24 ENCOUNTER — Ambulatory Visit (INDEPENDENT_AMBULATORY_CARE_PROVIDER_SITE_OTHER): Payer: Medicare Other | Admitting: Vascular Surgery

## 2017-12-24 VITALS — BP 151/73 | HR 60 | Resp 16 | Ht 61.5 in | Wt 247.0 lb

## 2017-12-24 DIAGNOSIS — E66813 Obesity, class 3: Secondary | ICD-10-CM | POA: Insufficient documentation

## 2017-12-24 DIAGNOSIS — R600 Localized edema: Secondary | ICD-10-CM

## 2017-12-24 DIAGNOSIS — I10 Essential (primary) hypertension: Secondary | ICD-10-CM

## 2017-12-24 NOTE — Progress Notes (Signed)
Subjective:    Patient ID: Sandra Hubbard, female    DOB: [birth year], 77 y.o.   MRN: [id number] Chief Complaint  Patient presents with  . New Patient (Initial Visit)    ref Candiss Norse for Lymphedema   Presents as a new patient referred by Dr. Candiss Norse for evaluation of bilateral lower extremity edema.  The patient endorses a history of "years" of experiencing swelling to the bilateral lower legs.  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  At this time, the patient does not engage in conservative therapy including wearing medical grade 1 compression socks and elevating her legs on a daily basis.  The patient has tried to wear compression socks in the past however has been unable to find the correct fitting size.  The patient notes that she bought a "pump" off of [place] which does not work.  The patient notes that her swelling is worse towards the end of the day.  The swelling is associated with some discomfort.  The patient notes that over the last 5 years, a reddish tent has developed to her bilateral shins.  The patient denies any claudication-like symptoms, rest pain or ulcer formation to the bilateral lower extremity.  The patient does have "spine issues".  The patient denies any recent surgery or trauma to the bilateral lower extremity.  The patient denies any fever, nausea or vomiting.  Review of Systems  Constitutional: Negative.   HENT: Negative.   Eyes: Negative.   Respiratory: Negative.   Cardiovascular: Positive for leg swelling.  Gastrointestinal: Negative.   Endocrine: Negative.   Genitourinary: Negative.   Musculoskeletal: Negative.   Skin: Negative.   Allergic/Immunologic: Negative.   Neurological: Negative.   Hematological: Negative.   Psychiatric/Behavioral: Negative.       Objective:   Physical Exam  Constitutional: She is oriented to person, place, and time. She appears well-developed and  well-nourished. No distress.  HENT:  Head: Normocephalic and atraumatic.  Right Ear: External ear normal.  Left Ear: External ear normal.  Eyes: Pupils are equal, round, and reactive to light. Conjunctivae and EOM are normal.  Neck: Normal range of motion.  Cardiovascular: Normal rate, regular rhythm, normal heart sounds and intact distal pulses.  Pulses:      Radial pulses are 2+ on the right side, and 2+ on the left side.  Hard to palpate pedal pulses due to edema and body habitus  Pulmonary/Chest: Effort normal and breath sounds normal.  Musculoskeletal: Normal range of motion. She exhibits edema (Moderate nonpitting edema noted bilaterally).  Neurological: She is alert and oriented to person, place, and time.  Skin: Skin is warm and dry. She is not diaphoretic.  Moderate stasis dermatitis noted to the bilateral shins.  There is the beginning of fibrosis noted bilaterally.  There is no active cellulitis or ulcerations.  Psychiatric: She has a normal mood and affect. Her behavior is normal. Judgment and thought content normal.  Vitals reviewed.  BP (!) 151/73 (BP Location: Right Arm)   Pulse 60   Resp 16   Ht 5' 1.5" (1.562 m)   Wt 247 lb (112 kg)   BMI 45.91 kg/m   Past Medical History:  Diagnosis Date  . Chronic anemia   . DJD (degenerative joint disease)   . Edema of both legs   . Hyperlipidemia, mixed   . Hypertension   . Osteoarthritis of both knees   .  Osteoarthritis of both knees   . Ovarian cancer ([place])   . Ovarian cancer ([place])   . Rhinitis, allergic   . RLS (restless legs syndrome)   . Sciatica   . Uterine cancer ([place])   . Uterine cancer [facility])    Social History   Socioeconomic History  . Marital status: Widowed    Spouse name: Not on file  . Number of children: Not on file  . Years of education: Not on file  . Highest education level: Not on file  Occupational History  . Not on file  Social Needs  . Financial resource strain: Not on file  . Food  insecurity:    Worry: Not on file    Inability: Not on file  . Transportation needs:    Medical: Not on file    Non-medical: Not on file  Tobacco Use  . Smoking status: Never Smoker  . Smokeless tobacco: Never Used  Substance and Sexual Activity  . Alcohol use: No  . Drug use: No  . Sexual activity: Not on file  Lifestyle  . Physical activity:    Days per week: Not on file    Minutes per session: Not on file  . Stress: Not on file  Relationships  . Social connections:    Talks on phone: Not on file    Gets together: Not on file    Attends religious service: Not on file    Active member of club or organization: Not on file    Attends meetings of clubs or organizations: Not on file    Relationship status: Not on file  . Intimate partner violence:    Fear of current or ex partner: Not on file    Emotionally abused: Not on file    Physically abused: Not on file    Forced sexual activity: Not on file  Other Topics Concern  . Not on file  Social History Narrative  . Not on file   Past Surgical History:  Procedure Laterality Date  . ABDOMINAL HYSTERECTOMY    . COLONOSCOPY N/A 12/21/2014   Procedure: COLONOSCOPY;  Surgeon: Josefine Class, MD;  Location: [facility] ENDOSCOPY;  Service: Endoscopy;  Laterality: N/A;  . DILATION AND CURETTAGE, DIAGNOSTIC / THERAPEUTIC    . ESOPHAGOGASTRODUODENOSCOPY N/A 12/21/2014   Procedure: ESOPHAGOGASTRODUODENOSCOPY (EGD);  Surgeon: Josefine Class, MD;  Location: [facility] ENDOSCOPY;  Service: Endoscopy;  Laterality: N/A;  . ESOPHAGOGASTRODUODENOSCOPY N/A 01/27/2015   Procedure: ESOPHAGOGASTRODUODENOSCOPY (EGD);  Surgeon: Josefine Class, MD;  Location: [facility] ENDOSCOPY;  Service: Endoscopy;  Laterality: N/A;  . ESOPHAGOGASTRODUODENOSCOPY (EGD) WITH PROPOFOL N/A 06/07/2017   Procedure: ESOPHAGOGASTRODUODENOSCOPY (EGD) WITH PROPOFOL;  Surgeon: Toledo, Benay Pike, MD;  Location: [facility] ENDOSCOPY;  Service: Gastroenterology;  Laterality: N/A;  .  TONSILLECTOMY     Family History  Problem Relation Age of Onset  . Breast cancer Neg Hx    Allergies  Allergen Reactions  . Maxzide [Hydrochlorothiazide W-Triamterene] Other (See Comments)    unk  . Donnatal [Pb-Hyoscy-Atropine-Scopolamine] Itching and Rash      Assessment & Plan:  Presents as a new patient referred by Dr. Candiss Norse for evaluation of bilateral lower extremity edema.  The patient endorses a history of "years" of experiencing swelling to the bilateral lower legs.  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  At this time, the patient does not engage in conservative therapy including wearing medical grade 1  compression socks and elevating her legs on a daily basis.  The patient has tried to wear compression socks in the past however has been unable to find the correct fitting size.  The patient notes that she bought a "pump" off of [place] which does not work.  The patient notes that her swelling is worse towards the end of the day.  The swelling is associated with some discomfort.  The patient notes that over the last 5 years, a reddish tent has developed to her bilateral shins.  The patient denies any claudication-like symptoms, rest pain or ulcer formation to the bilateral lower extremity.  The patient does have "spine issues".  The patient denies any recent surgery or trauma to the bilateral lower extremity.  The patient denies any fever, nausea or vomiting.  1. Bilateral lower extremity edema - New The patient was encouraged to wear graduated compression stockings (20-30 mmHg) on a daily basis. The patient was instructed to begin wearing the stockings first thing in the morning and removing them in the evening. The patient was instructed specifically not to sleep in the stockings. Prescription given. We discussed both conventional compression socks and if our wraps as an option. In addition, behavioral modification  including elevation during the day will be initiated. Anti-inflammatories for pain. The patient will follow up in one months to asses conservative management.  Information on compression stockings was given to the patient. The patient was instructed to call the office in the interim if any worsening edema or ulcerations to the legs, feet or toes occurs. The patient expresses their understanding.  - VAS [place] LOWER EXTREMITY VENOUS REFLUX; Future  2. Essential hypertension - Stable Encouraged good control as its slows the progression of atherosclerotic disease  3. Morbid obesity ([place]) - Stable This is a contributing factor to the patient's bilateral lower extremity edema This is followed by the patient's primary care physician The patient was encouraged to increase her activity level.  Current Outpatient Medications on File Prior to Visit  Medication Sig Dispense Refill  . acetaminophen (TYLENOL) 650 MG CR tablet Take 1,300 mg by mouth every 8 (eight) hours as needed for pain.    Marland Kitchen aspirin 81 MG EC tablet Take 81 mg by mouth daily.  3  . atenolol (TENORMIN) 50 MG tablet Take 50 mg by mouth daily.  3  . co-enzyme Q-10 30 MG capsule Take 30 mg by mouth daily.     . ferrous sulfate 325 (65 FE) MG tablet Take 325 mg by mouth daily with breakfast.    . fexofenadine (ALLEGRA) 180 MG tablet Take 180 mg by mouth daily.    . fluticasone (FLONASE) 50 MCG/ACT nasal spray Place 2 sprays into both nostrils daily.  12  . furosemide (LASIX) 40 MG tablet Take 40 mg by mouth daily.    Marland Kitchen glucosamine-chondroitin 500-400 MG tablet Take 2 tablets by mouth 2 (two) times daily.    Marland Kitchen KLOR-CON 10 10 MEQ tablet Take 10 mEq by mouth daily.  3  . lisinopril-hydrochlorothiazide (PRINZIDE,ZESTORETIC) 20-12.5 MG per tablet Take 2 tablets by mouth daily.  3  . Lorcaserin HCl 10 MG TABS Take 10 mg by mouth.    . magnesium oxide (MAG-OX) 400 MG tablet Take 400 mg by mouth 2 (two) times daily.    . Misc Natural Products  (LEG VEIN & CIRCULATION PO) Take 1 tablet by mouth 2 (two) times daily.    . Multiple Vitamins-Minerals (MULTIVITAMIN WITH MINERALS) tablet Take 2-3 tablets by  mouth 2 (two) times daily. 3 tab in the morning and 2 in the afternoon    . Omega-3 Fatty Acids (OMEGA III EPA+DHA PO) Take 1 tablet by mouth 2 (two) times daily.    Marland Kitchen omeprazole (PRILOSEC) 40 MG capsule Take 40 mg by mouth daily.    . traMADol (ULTRAM) 50 MG tablet Take by mouth every 6 (six) hours as needed.    . cetirizine (ZYRTEC) 10 MG tablet Take 10 mg by mouth daily.    . Cholecalciferol (VITAMIN D) 2000 UNITS CAPS Take 1 capsule by mouth 2 (two) times daily.    . potassium chloride (K-DUR,KLOR-CON) 10 MEQ tablet Take 10 mEq by mouth daily.    Manus Gunning BOWEL PREP SOLN Take 1 each by mouth once.  0   No current facility-administered medications on file prior to visit.    There are no Patient Instructions on file for this visit. No follow-ups on file.  Kinsey Karch A Babe Clenney, PA-C

## 2018-02-04 ENCOUNTER — Encounter (INDEPENDENT_AMBULATORY_CARE_PROVIDER_SITE_OTHER): Payer: TRICARE For Life (TFL)

## 2018-02-04 ENCOUNTER — Ambulatory Visit (INDEPENDENT_AMBULATORY_CARE_PROVIDER_SITE_OTHER): Payer: TRICARE For Life (TFL) | Admitting: Vascular Surgery

## 2018-03-04 IMAGING — US US BREAST*L* LIMITED INC AXILLA
1 series · 11 of 11 positions shown · non-contrast
Comparison: Screening mammograms dated 10/19/2014 and 02/12/2017

ADDENDUM:
The patient's outside prior mammograms from January 21, 2012,
01/02/2011, 11/15/2009, 11/03/2009, 10/13/2008, 08/12/2007,
07/30/2007, 09/23/2006, 03/26/2006, and 02/26/2006 have been
obtained and comparison made. The possible asymmetry identified on
screening mammogram within the subareolar left breast was likely
present on prior exams but is more prominent on the recent study.
Ultrasound-guided biopsy of the intraductal debris versus mass at 12
o'clock, 1 cm from the nipple and of the intraductal debris versus
mass at 1 o'clock, 1 cm from the nipple is recommended.
CLINICAL DATA: Callback from screening mammogram

EXAM:
2D DIGITAL DIAGNOSTIC LEFT MAMMOGRAM WITH CAD AND ADJUNCT TOMO
ULTRASOUND LEFT BREAST

[Series 1: us breast*left* limited inc axilla · 0.06mm/px · 11 of 11 slices shown]
[im 1/11]
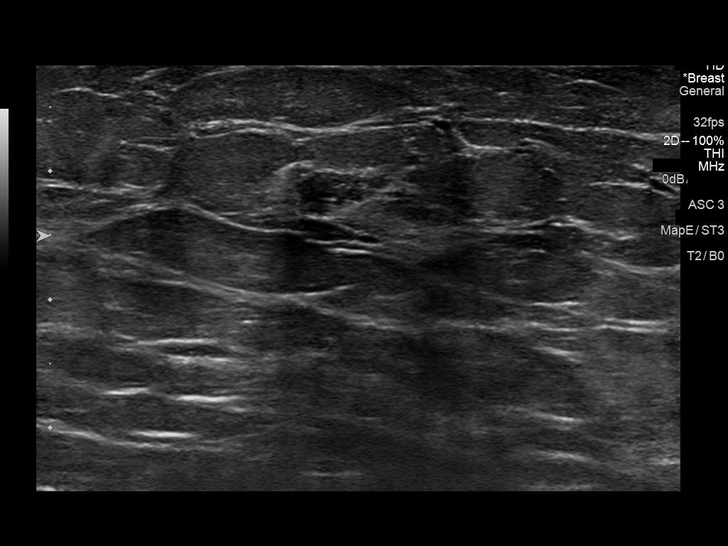
[im 2/11]
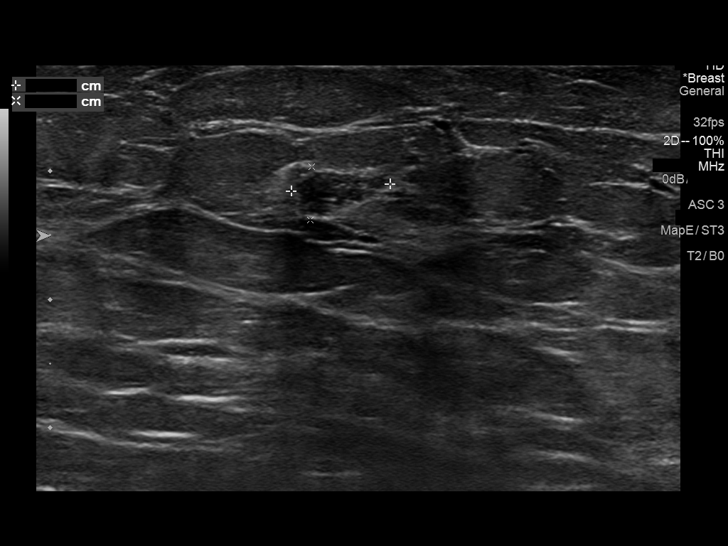
[im 3/11]
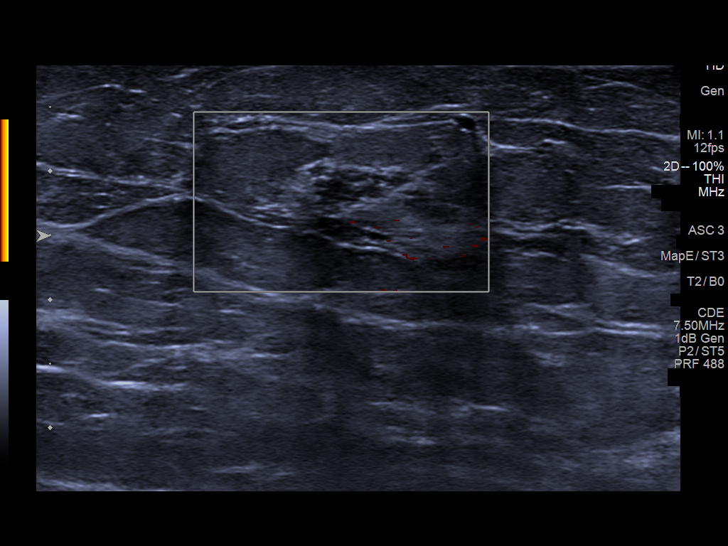
[im 4/11]
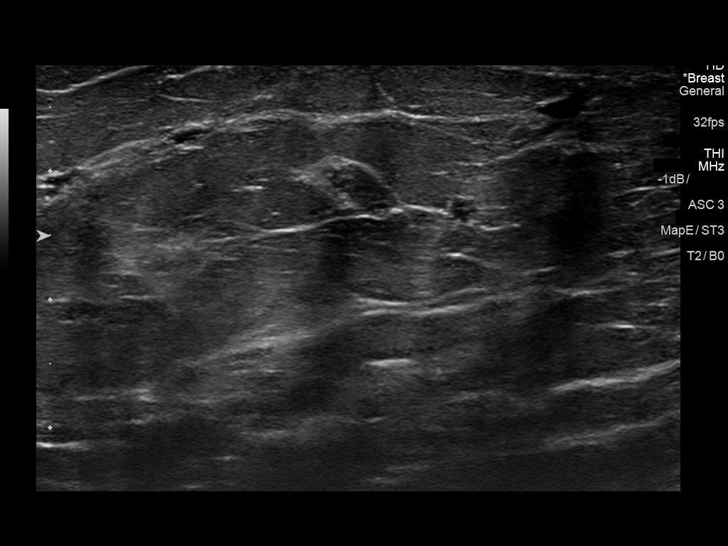
[im 5/11]
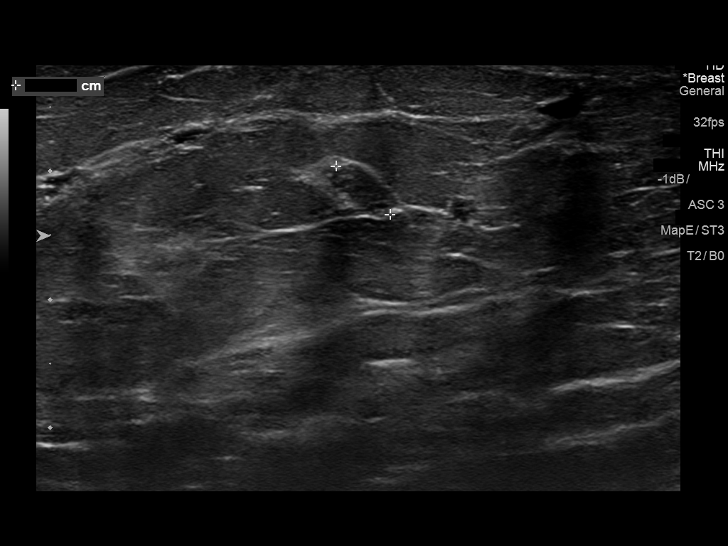
[im 6/11]
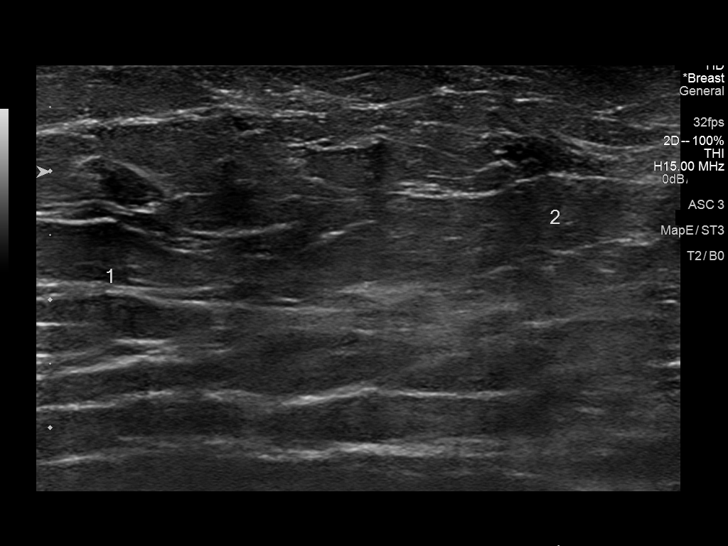
[im 7/11]
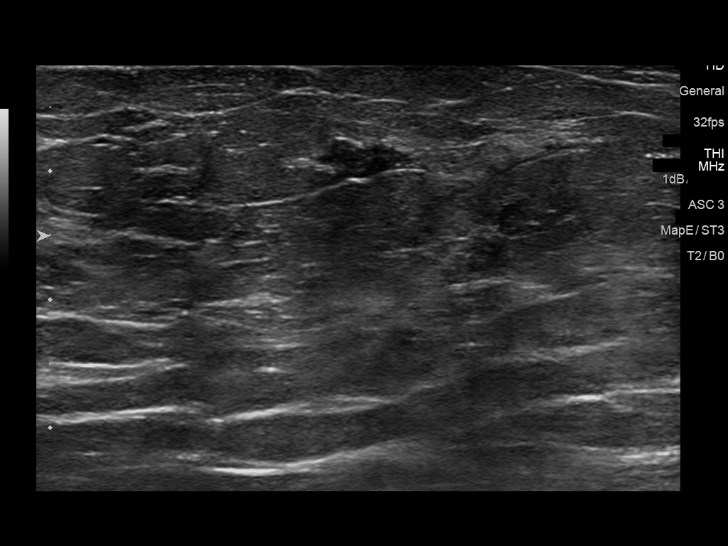
[im 8/11]
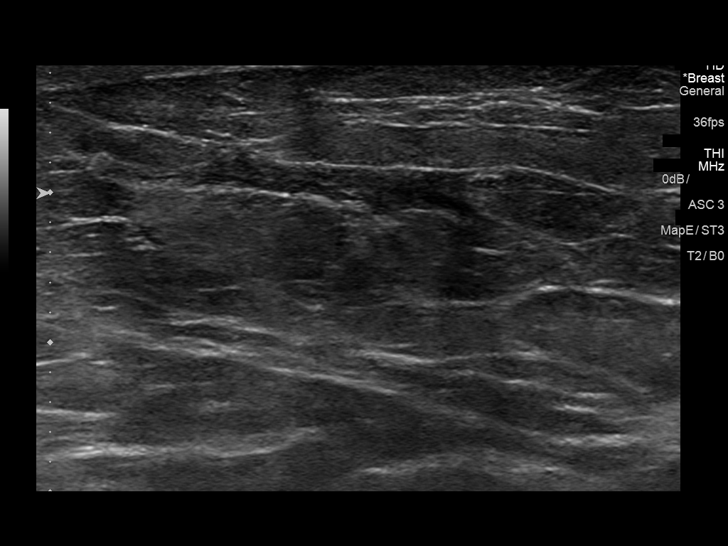
[im 9/11]
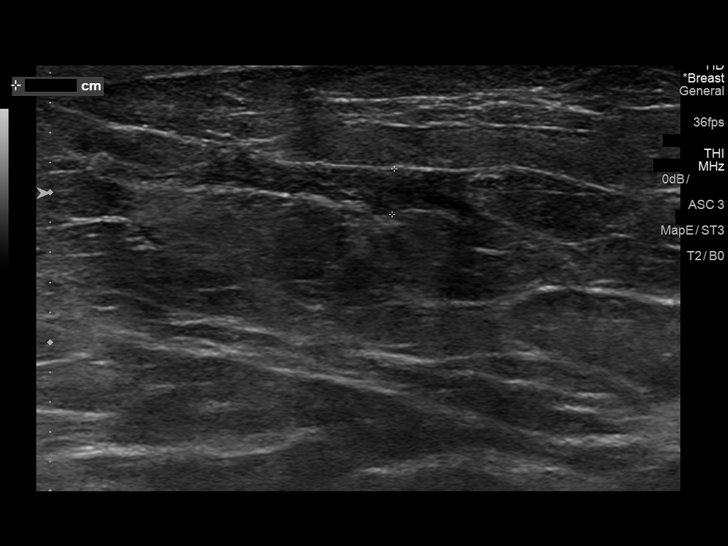
[im 10/11]
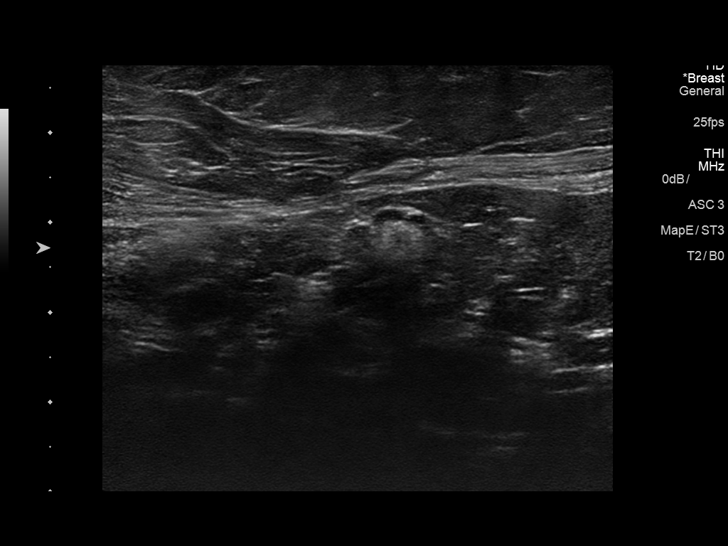
[im 11/11]
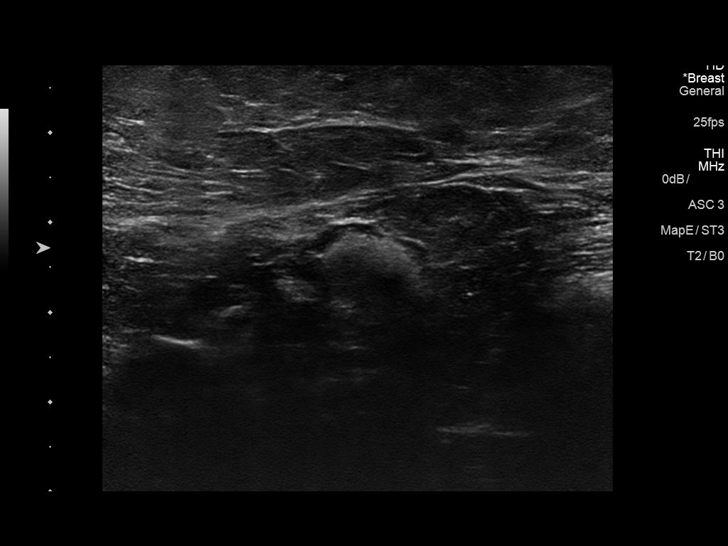

[11 of 11 positions shown; findings below may reference images not displayed]

IMPRESSION: 1. Intraductal debris versus mass at 12 o'clock, 1 cm from the
nipple.

2. Intraductal debris versus mass at 1 o'clock, 1 cm from the
nipple.

RECOMMENDATION:

Ultrasound-guided biopsy of the intraductal debris versus mass at 12
o'clock, 1 cm from the nipple and of the intraductal debris versus
mass at 1 o'clock, 1 cm from the nipple.

BI-RADS CATEGORY 4: Suspicious.
ACR Breast Density Category b: There are scattered areas of
fibroglandular density.
FINDINGS: The possible asymmetry identified within the subareolar left breast
persists on today's spot compression views.

Mammographic images were processed with CAD.

On physical exam, no discrete mass is felt in the area of concern
within the subareolar left breast. The patient denies any nipple
discharge.

Targeted ultrasound of the subareolar left breast was performed
demonstrating an 8 x 4 x 6 mm area of intraductal echogenic material
at 12 o'clock, 1 cm from the nipple. A similar-appearing area was
identified at 1 o'clock, 1 cm from the nipple measuring 6 x 3 x 3
mm. No internal vascularity was identified within either area.
IMPRESSION: Incomplete evaluation.

RECOMMENDATION:
Attempt will be made to obtain the patient's older outside prior
mammograms and comparison made. An addendum will be made at that
time.

I have discussed the findings and recommendations with the patient.
Results were also provided in writing at the conclusion of the
visit. If applicable, a reminder letter will be sent to the patient
regarding the next appointment.

BI-RADS CATEGORY  0: Incomplete. Need additional imaging evaluation
and/or prior mammograms for comparison.

## 2018-03-18 ENCOUNTER — Encounter

## 2018-03-18 ENCOUNTER — Encounter (INDEPENDENT_AMBULATORY_CARE_PROVIDER_SITE_OTHER): Payer: Self-pay | Admitting: Vascular Surgery

## 2018-03-18 ENCOUNTER — Ambulatory Visit (INDEPENDENT_AMBULATORY_CARE_PROVIDER_SITE_OTHER): Payer: Medicare Other | Admitting: Vascular Surgery

## 2018-03-18 ENCOUNTER — Ambulatory Visit (INDEPENDENT_AMBULATORY_CARE_PROVIDER_SITE_OTHER): Payer: Medicare Other

## 2018-03-18 VITALS — BP 135/69 | HR 58 | Resp 16 | Ht 61.5 in | Wt 235.8 lb

## 2018-03-18 DIAGNOSIS — I872 Venous insufficiency (chronic) (peripheral): Secondary | ICD-10-CM | POA: Diagnosis not present

## 2018-03-18 DIAGNOSIS — I890 Lymphedema, not elsewhere classified: Secondary | ICD-10-CM

## 2018-03-18 DIAGNOSIS — R600 Localized edema: Secondary | ICD-10-CM | POA: Diagnosis not present

## 2018-03-18 NOTE — Progress Notes (Signed)
Subjective:    Patient ID: Sandra Hubbard, female    DOB: 30-Apr-1941, 77 y.o.   MRN: [id number] Chief Complaint  Patient presents with  . Follow-up    pt conv bil ven reflux     HPI  Patient presents today for a 2 month follow-up for bilateral lower extremity swelling.  She underwent a lower extremity reflux study.  Since we last saw her she endorses using medical grade 1 compression socks on a regular basis, utilizing elevation on a daily basis, and remaining active.   Although she is engaging in conservative therapy,  some swelling and discomfort in the bilateral lower extremities still exists. She states these symptoms have begun to become life style limiting, as these symptoms have affected her ability to function on a daily basis.  She denies any new open wounds, weeping, or varicosities.  She also denies any nausea, vomiting, fever, or diarrhea.  The results of her bilateral lower venous reflux study revealed that in the right leg there were abnormal reflux times noted in the great saphenous vein at the proximal thigh however no evidence of DVT in the right leg there was also mild venous insufficiency at the proximal great saphenous vein as well.  In the left abnormal reflux times were noted to the great saphenous vein at the saphenofemoral junction in the great saphenous vein at the mid thigh there is no evidence of DVT in the left leg as well.    Constitutional: [] Weight loss  [] Fever  [] Chills Cardiac: [] Chest pain   [] Chest pressure   [] Palpitations   [] Shortness of breath when laying flat   [] Shortness of breath with exertion. Vascular:  [] Pain in legs with walking   [] Pain in legs with standing  [] History of DVT   [] Phlebitis   [x] Swelling in legs   [] Varicose veins   [] Non-healing ulcers Pulmonary:   [] Uses home oxygen   [] Productive cough   [] Hemoptysis   [] Wheeze  [] COPD   [] Asthma Neurologic:  [] Dizziness   [] Seizures   [] History of stroke   [] History of TIA  [] Aphasia    [] Vissual changes   [] Weakness or numbness in arm   [] Weakness or numbness in leg Musculoskeletal:   [] Joint swelling   [] Joint pain   [] Low back pain Hematologic:  [] Easy bruising  [] Easy bleeding   [] Hypercoagulable state   [] Anemic Gastrointestinal:  [] Diarrhea   [] Vomiting  [] Gastroesophageal reflux/heartburn   [] Difficulty swallowing. Genitourinary:  [] Chronic kidney disease   [] Difficult urination  [] Frequent urination   [] Blood in urine Skin:  [x] Rashes   [] Ulcers  Psychological:  [] History of anxiety   []  History of major depression.     Objective:   Physical Exam  BP 135/69 (BP Location: Right Arm)   Pulse (!) 58   Resp 16   Ht 5' 1.5" (1.562 m)   Wt 235 lb 12.8 oz (107 kg)   BMI 43.83 kg/m   Past Medical History:  Diagnosis Date  . Chronic anemia   . DJD (degenerative joint disease)   . Edema of both legs   . Hyperlipidemia, mixed   . Hypertension   . Osteoarthritis of both knees   . Osteoarthritis of both knees   . Ovarian cancer ([place])   . Ovarian cancer ([place])   . Rhinitis, allergic   . RLS (restless legs syndrome)   . Sciatica   . Uterine cancer ([place])   . Uterine cancer ([place])      Gen: WD/WN, NAD  Head: [place]/AT, No temporalis wasting.  Ear/Nose/Throat: Hearing grossly intact, nares w/o erythema or drainage, poor dentition Eyes: PER, EOMI, sclera nonicteric.  Neck: Supple, no masses.  No bruit or JVD.  Pulmonary:  Good air movement, clear to auscultation bilaterally, no use of accessory muscles.  Cardiac: RRR, normal S1, S2, no Murmurs. Vascular: Large varicosities non-present,.  Mild venous stasis dermatitis changes to the legs bilaterally.  2+ soft pitting edema Vessel Right Left  Radial    Brachial    Femoral    Popliteal    PT 2+ 2+  DP 1+ 1+  Carotid    Gastrointestinal: soft, non-distended. No guarding/no peritoneal signs.  Musculoskeletal: M/S 5/5 throughout.  No deformity or atrophy.  Neurologic: CN 2-12 intact. Pain and light touch intact in  extremities.  Symmetrical.  Speech is fluent. Motor exam as listed above. Psychiatric: Judgment intact, Mood & affect appropriate for pt's clinical situation. Dermatologic: Venous rashes no ulcers noted.  No changes consistent with cellulitis. Lymph : No Cervical lymphadenopathy,  Mild stasis dermatitis   Social History   Socioeconomic History  . Marital status: Widowed    Spouse name: Not on file  . Number of children: Not on file  . Years of education: Not on file  . Highest education level: Not on file  Occupational History  . Not on file  Social Needs  . Financial resource strain: Not on file  . Food insecurity:    Worry: Not on file    Inability: Not on file  . Transportation needs:    Medical: Not on file    Non-medical: Not on file  Tobacco Use  . Smoking status: Never Smoker  . Smokeless tobacco: Never Used  Substance and Sexual Activity  . Alcohol use: No  . Drug use: No  . Sexual activity: Not on file  Lifestyle  . Physical activity:    Days per week: Not on file    Minutes per session: Not on file  . Stress: Not on file  Relationships  . Social connections:    Talks on phone: Not on file    Gets together: Not on file    Attends religious service: Not on file    Active member of club or organization: Not on file    Attends meetings of clubs or organizations: Not on file    Relationship status: Not on file  . Intimate partner violence:    Fear of current or ex partner: Not on file    Emotionally abused: Not on file    Physically abused: Not on file    Forced sexual activity: Not on file  Other Topics Concern  . Not on file  Social History Narrative  . Not on file    Past Surgical History:  Procedure Laterality Date  . ABDOMINAL HYSTERECTOMY    . COLONOSCOPY N/A 12/21/2014   Procedure: COLONOSCOPY;  Surgeon: Josefine Class, MD;  Location: [facility] ENDOSCOPY;  Service: Endoscopy;  Laterality: N/A;  . DILATION AND CURETTAGE, DIAGNOSTIC / THERAPEUTIC      . ESOPHAGOGASTRODUODENOSCOPY N/A 12/21/2014   Procedure: ESOPHAGOGASTRODUODENOSCOPY (EGD);  Surgeon: Josefine Class, MD;  [facility] ENDOSCOPY;  Service: Endoscopy;  Laterality: N/A;  . ESOPHAGOGASTRODUODENOSCOPY N/A 01/27/2015   Procedure: ESOPHAGOGASTRODUODENOSCOPY (EGD);  Surgeon: Josefine Class, MD;  Location: [facility] ENDOSCOPY;  Service: Endoscopy;  Laterality: N/A;  . ESOPHAGOGASTRODUODENOSCOPY (EGD) WITH PROPOFOL N/A 06/07/2017   Procedure: ESOPHAGOGASTRODUODENOSCOPY (EGD) WITH PROPOFOL;  Surgeon: Toledo, Benay Pike, MD;  Location: [facility] ENDOSCOPY;  Service: Gastroenterology;  Laterality: N/A;  . TONSILLECTOMY      Family History  Problem Relation Age of Onset  . Breast cancer Neg Hx     Allergies  Allergen Reactions  . Maxzide [Hydrochlorothiazide W-Triamterene] Other (See Comments)    unk  . Donnatal [Pb-Hyoscy-Atropine-Scopolamine] Itching and Rash       Assessment & Plan:  Patient presents today for a 2 month follow-up for bilateral lower extremity swelling.  She underwent a lower extremity reflux study.  Since we last saw her she endorses using medical grade 1 compression socks on a regular basis, utilizing elevation on a daily basis, and remaining active.   Although she is engaging in conservative therapy,  some swelling and discomfort in the bilateral lower extremities still exists. She states these symptoms have begun to become life style limiting, as these symptoms have affected her ability to function on a daily basis.  She denies any new open wounds, weeping, or varicosities.  She also denies any nausea, vomiting, fever, or diarrhea.  The results of her bilateral lower venous reflux study revealed that in the right leg there were abnormal reflux times noted in the great saphenous vein at the proximal thigh however no evidence of DVT in the right leg there was also mild venous insufficiency at the proximal great saphenous vein as well.  In the left abnormal reflux  times were noted to the great saphenous vein at the saphenofemoral junction in the great saphenous vein at the mid thigh there is no evidence of DVT in the left leg as well.   1. Lymphedema-New Ms. Morton presents with lymphedema following venous reflux study.  She has changes consistent with 2+ pitting edema and dermal thickening.  She has been recommended conservative therapy which she has been following utilizing class I medical grade compression stockings, daily elevation, and exercise.  Her symptoms have been persisting since her last visit, which was over her 30 days.  Despite conservative treatments including exercise elevation and class I compression stockings the patient still presents with stage II lymphedema.  Recommended continued conservative therapy, as well as the addition of a lymphedema pump.  We will follow-up in 3 months to determine how conservative therapy and lymph pump therapy have been working. I will apply to patient's insurance for lymphedema pump.   2. Chronic Venous Insufficiency-New As above  2. Morbid obesity (HCC)-stable Patient's obesity is a contributory factor to her lymphedema.  The patient has made a personal commitment to lose 15 pounds in the next 3 months.  She will continue following up with her primary care physician to help guide her towards this goal.  Current Outpatient Medications on File Prior to Visit  Medication Sig Dispense Refill  . acetaminophen (TYLENOL) 650 MG CR tablet Take 1,300 mg by mouth every 8 (eight) hours as needed for pain.    Marland Kitchen aspirin 81 MG EC tablet Take 81 mg by mouth daily.  3  . atenolol (TENORMIN) 50 MG tablet Take 50 mg by mouth daily.  3  . cetirizine (ZYRTEC) 10 MG tablet Take 10 mg by mouth daily.    . Cholecalciferol (VITAMIN D) 2000 UNITS CAPS Take 1 capsule by mouth 2 (two) times daily.    Marland Kitchen co-enzyme Q-10 30 MG capsule Take 30 mg by mouth daily.     . ferrous sulfate 325 (65 FE) MG tablet Take 325 mg by mouth daily  with breakfast.    . fexofenadine (ALLEGRA) 180 MG tablet Take 180  mg by mouth daily.    . fluticasone (FLONASE) 50 MCG/ACT nasal spray Place 2 sprays into both nostrils daily.  12  . furosemide (LASIX) 40 MG tablet Take 40 mg by mouth daily.    Marland Kitchen glucosamine-chondroitin 500-400 MG tablet Take 2 tablets by mouth 2 (two) times daily.    Marland Kitchen KLOR-CON 10 10 MEQ tablet Take 10 mEq by mouth daily.  3  . lisinopril-hydrochlorothiazide (PRINZIDE,ZESTORETIC) 20-12.5 MG per tablet Take 2 tablets by mouth daily.  3  . Lorcaserin HCl 10 MG TABS Take 10 mg by mouth.    . magnesium oxide (MAG-OX) 400 MG tablet Take 400 mg by mouth 2 (two) times daily.    . Misc Natural Products (LEG VEIN & CIRCULATION PO) Take 1 tablet by mouth 2 (two) times daily.    . Multiple Vitamins-Minerals (MULTIVITAMIN WITH MINERALS) tablet Take 2-3 tablets by mouth 2 (two) times daily. 3 tab in the morning and 2 in the afternoon    . Omega-3 Fatty Acids (OMEGA III EPA+DHA PO) Take 1 tablet by mouth 2 (two) times daily.    Marland Kitchen omeprazole (PRILOSEC) 40 MG capsule Take 40 mg by mouth daily.    . potassium chloride (K-DUR,KLOR-CON) 10 MEQ tablet Take 10 mEq by mouth daily.    Manus Gunning BOWEL PREP SOLN Take 1 each by mouth once.  0  . traMADol (ULTRAM) 50 MG tablet Take by mouth every 6 (six) hours as needed.     No current facility-administered medications on file prior to visit.       There are no Patient Instructions on file for this visit. Return in about 3 months (around 06/18/2018).   Kris Hartmann, NP

## 2018-05-28 ENCOUNTER — Ambulatory Visit (INDEPENDENT_AMBULATORY_CARE_PROVIDER_SITE_OTHER): Payer: TRICARE For Life (TFL) | Admitting: Vascular Surgery

## 2018-05-28 ENCOUNTER — Encounter (INDEPENDENT_AMBULATORY_CARE_PROVIDER_SITE_OTHER): Payer: Medicare Other

## 2018-06-18 ENCOUNTER — Encounter (INDEPENDENT_AMBULATORY_CARE_PROVIDER_SITE_OTHER): Payer: Self-pay | Admitting: Vascular Surgery

## 2018-06-18 ENCOUNTER — Ambulatory Visit (INDEPENDENT_AMBULATORY_CARE_PROVIDER_SITE_OTHER): Payer: Medicare Other | Admitting: Vascular Surgery

## 2018-06-18 VITALS — BP 133/65 | HR 63 | Resp 16 | Ht 61.5 in | Wt 233.8 lb

## 2018-06-18 DIAGNOSIS — I872 Venous insufficiency (chronic) (peripheral): Secondary | ICD-10-CM | POA: Diagnosis not present

## 2018-06-18 DIAGNOSIS — I890 Lymphedema, not elsewhere classified: Secondary | ICD-10-CM

## 2018-06-18 DIAGNOSIS — I10 Essential (primary) hypertension: Secondary | ICD-10-CM

## 2018-06-18 NOTE — Progress Notes (Signed)
Subjective:    Patient ID: Sandra Hubbard, female    DOB: April 30, 1941, 77 y.o.   MRN: [id number] Chief Complaint  Patient presents with  . Follow-up    49month follow up   Patient presents for a three-month lymphedema follow-up.  Since our last visit, the patient has been engaging in conservative therapy including wearing medical grade one compression socks, elevating her legs, remaining active and using her lymphedema pump at least one time a day for an hour towards the evening.  The patient has noted a market improvement in the swelling to her lower extremities.  The patient states that the skin on her bilateral legs has also improved.  The patient is very pleased with the improvement in her symptoms.  Patient is also changed her lifestyle and now is not prediabetic.  Patient denies any claudication-like symptoms, rest pain or ulcer formation to the bilateral lower extremity.  Patient denies any recurrent bouts of cellulitis.  Patient denies any fever, nausea vomiting.  Review of Systems  Constitutional: Negative.   HENT: Negative.   Eyes: Negative.   Respiratory: Negative.   Cardiovascular: Positive for leg swelling.  Gastrointestinal: Negative.   Endocrine: Negative.   Genitourinary: Negative.   Musculoskeletal: Negative.   Skin: Negative.   Allergic/Immunologic: Negative.   Neurological: Negative.   Hematological: Negative.   Psychiatric/Behavioral: Negative.       Objective:   Physical Exam  Constitutional: She is oriented to person, place, and time. She appears well-developed and well-nourished. No distress.  HENT:  Head: Normocephalic and atraumatic.  Right Ear: External ear normal.  Left Ear: External ear normal.  Eyes: Pupils are equal, round, and reactive to light. Conjunctivae and EOM are normal.  Neck: Normal range of motion.  Cardiovascular: Normal rate, regular rhythm, normal heart sounds and intact distal pulses.  Pulses:      Radial pulses are 2+ on the right  side, and 2+ on the left side.       Dorsalis pedis pulses are 1+ on the right side, and 1+ on the left side.       Posterior tibial pulses are 1+ on the right side, and 1+ on the left side.  Pulmonary/Chest: Effort normal and breath sounds normal.  Musculoskeletal: Normal range of motion. She exhibits edema (Minimal amount of edema noted to the bilateral legs.).  Neurological: She is alert and oriented to person, place, and time.  Skin: She is not diaphoretic.  Mild stasis dermatitis bilaterally.  There is no fibrosis, cellulitis, active ulcerations noted at this time.  Psychiatric: She has a normal mood and affect. Her behavior is normal. Judgment and thought content normal.  Vitals reviewed.  BP 133/65 (BP Location: Right Arm)   Pulse 63   Resp 16   Ht 5' 1.5" (1.562 m)   Wt 233 lb 12.8 oz (106.1 kg)   BMI 43.46 kg/m   Past Medical History:  Diagnosis Date  . Chronic anemia   . DJD (degenerative joint disease)   . Edema of both legs   . Hyperlipidemia, mixed   . Hypertension   . Osteoarthritis of both knees   . Osteoarthritis of both knees   . Ovarian cancer ([place])   . Ovarian cancer ([place])   . Rhinitis, allergic   . RLS (restless legs syndrome)   . Sciatica   . Uterine cancer ([place])   . Uterine cancer [facility])    Social History   Socioeconomic History  . Marital status: Widowed  Spouse name: Not on file  . Number of children: Not on file  . Years of education: Not on file  . Highest education level: Not on file  Occupational History  . Not on file  Social Needs  . Financial resource strain: Not on file  . Food insecurity:    Worry: Not on file    Inability: Not on file  . Transportation needs:    Medical: Not on file    Non-medical: Not on file  Tobacco Use  . Smoking status: Never Smoker  . Smokeless tobacco: Never Used  Substance and Sexual Activity  . Alcohol use: No  . Drug use: No  . Sexual activity: Not on file  Lifestyle  . Physical activity:     Days per week: Not on file    Minutes per session: Not on file  . Stress: Not on file  Relationships  . Social connections:    Talks on phone: Not on file    Gets together: Not on file    Attends religious service: Not on file    Active member of club or organization: Not on file    Attends meetings of clubs or organizations: Not on file    Relationship status: Not on file  . Intimate partner violence:    Fear of current or ex partner: Not on file    Emotionally abused: Not on file    Physically abused: Not on file    Forced sexual activity: Not on file  Other Topics Concern  . Not on file  Social History Narrative  . Not on file   Past Surgical History:  Procedure Laterality Date  . ABDOMINAL HYSTERECTOMY    . COLONOSCOPY N/A 12/21/2014   Procedure: COLONOSCOPY;  Surgeon: Josefine Class, MD;  Location: [facility] ENDOSCOPY;  Service: Endoscopy;  Laterality: N/A;  . DILATION AND CURETTAGE, DIAGNOSTIC / THERAPEUTIC    . ESOPHAGOGASTRODUODENOSCOPY N/A 12/21/2014   Procedure: ESOPHAGOGASTRODUODENOSCOPY (EGD);  Surgeon: Josefine Class, MD;  Location[facility] ENDOSCOPY;  Service: Endoscopy;  Laterality: N/A;  . ESOPHAGOGASTRODUODENOSCOPY N/A 01/27/2015   Procedure: ESOPHAGOGASTRODUODENOSCOPY (EGD);  Surgeon: Josefine Class, MD;  Location: [facility] ENDOSCOPY;  Service: Endoscopy;  Laterality: N/A;  . ESOPHAGOGASTRODUODENOSCOPY (EGD) WITH PROPOFOL N/A 06/07/2017   Procedure: ESOPHAGOGASTRODUODENOSCOPY (EGD) WITH PROPOFOL;  Surgeon: Toledo, Benay Pike, MD;  Location: [facility] ENDOSCOPY;  Service: Gastroenterology;  Laterality: N/A;  . TONSILLECTOMY     Family History  Problem Relation Age of Onset  . Breast cancer Neg Hx    Allergies  Allergen Reactions  . Maxzide [Hydrochlorothiazide W-Triamterene] Other (See Comments)    unk  . Donnatal [Pb-Hyoscy-Atropine-Scopolamine] Itching and Rash      Assessment & Plan:  Patient presents for a three-month lymphedema follow-up.  Since our last  visit, the patient has been engaging in conservative therapy including wearing medical grade one compression socks, elevating her legs, remaining active and using her lymphedema pump at least one time a day for an hour towards the evening.  The patient has noted a market improvement in the swelling to her lower extremities.  The patient states that the skin on her bilateral legs has also improved.  The patient is very pleased with the improvement in her symptoms.  Patient is also changed her lifestyle and now is not prediabetic.  Patient denies any claudication-like symptoms, rest pain or ulcer formation to the bilateral lower extremity.  Patient denies any recurrent bouts of cellulitis.  Patient denies any fever, nausea vomiting.  1. Lymphedema - Stable Patient presents for a lower extremity edema follow-up At this time, the patient's daily regimen is wearing medical grade 1 compression socks, elevating her legs, remaining active and using her lymphedema pump at least once a day for an hour. The patient has noted a market improvement in her bilateral lower extremity edema and discomfort Patient is very pleased with this improvement The patient should continue engaging in her current daily regimen and is to follow-up in 1 year I will add a bilateral ABI with her one-year follow-up to surveilled her peripheral artery disease. The patient was instructed to call the office in the interim if any worsening edema or ulcerations to the legs, feet or toes occurs. The patient expresses their understanding.  - VAS [place] ABI WITH/WO TBI; Future  2. Chronic venous insufficiency - Stable As above  3. Essential hypertension - Stable Encouraged good control as its slows the progression of atherosclerotic disease  Current Outpatient Medications on File Prior to Visit  Medication Sig Dispense Refill  . acetaminophen (TYLENOL) 650 MG CR tablet Take 1,300 mg by mouth every 8 (eight) hours as needed for pain.    Marland Kitchen  aspirin 81 MG EC tablet Take 81 mg by mouth daily.  3  . atenolol (TENORMIN) 50 MG tablet Take 50 mg by mouth daily.  3  . cetirizine (ZYRTEC) 10 MG tablet Take 10 mg by mouth daily.    . Cholecalciferol (VITAMIN D) 2000 UNITS CAPS Take 1 capsule by mouth 2 (two) times daily.    Marland Kitchen co-enzyme Q-10 30 MG capsule Take 30 mg by mouth daily.     . ferrous sulfate 325 (65 FE) MG tablet Take 325 mg by mouth daily with breakfast.    . fexofenadine (ALLEGRA) 180 MG tablet Take 180 mg by mouth daily.    . fluticasone (FLONASE) 50 MCG/ACT nasal spray Place 2 sprays into both nostrils daily.  12  . furosemide (LASIX) 40 MG tablet Take 40 mg by mouth daily.    Marland Kitchen glucosamine-chondroitin 500-400 MG tablet Take 2 tablets by mouth 2 (two) times daily.    Marland Kitchen KLOR-CON 10 10 MEQ tablet Take 10 mEq by mouth daily.  3  . lisinopril-hydrochlorothiazide (PRINZIDE,ZESTORETIC) 20-12.5 MG per tablet Take 2 tablets by mouth daily.  3  . Lorcaserin HCl 10 MG TABS Take 10 mg by mouth.    . magnesium oxide (MAG-OX) 400 MG tablet Take 400 mg by mouth 2 (two) times daily.    . Misc Natural Products (LEG VEIN & CIRCULATION PO) Take 1 tablet by mouth 2 (two) times daily.    . Multiple Vitamins-Minerals (MULTIVITAMIN WITH MINERALS) tablet Take 2-3 tablets by mouth 2 (two) times daily. 3 tab in the morning and 2 in the afternoon    . Omega-3 Fatty Acids (OMEGA III EPA+DHA PO) Take 1 tablet by mouth 2 (two) times daily.    Marland Kitchen omeprazole (PRILOSEC) 40 MG capsule Take 40 mg by mouth daily.    . potassium chloride (K-DUR,KLOR-CON) 10 MEQ tablet Take 10 mEq by mouth daily.    Manus Gunning BOWEL PREP SOLN Take 1 each by mouth once.  0  . traMADol (ULTRAM) 50 MG tablet Take by mouth every 6 (six) hours as needed.     No current facility-administered medications on file prior to visit.    There are no Patient Instructions on file for this visit. No follow-ups on file.  Dasha Kawabata A Kameron Glazebrook, PA-C

## 2018-08-26 ENCOUNTER — Other Ambulatory Visit: Payer: Self-pay | Admitting: Internal Medicine

## 2018-08-26 DIAGNOSIS — N6320 Unspecified lump in the left breast, unspecified quadrant: Secondary | ICD-10-CM

## 2018-08-26 DIAGNOSIS — Z87898 Personal history of other specified conditions: Secondary | ICD-10-CM

## 2018-08-26 DIAGNOSIS — Z1231 Encounter for screening mammogram for malignant neoplasm of breast: Secondary | ICD-10-CM

## 2018-09-17 ENCOUNTER — Ambulatory Visit
Admission: RE | Admit: 2018-09-17 | Discharge: 2018-09-17 | Disposition: A | Discharge status: Home / Self Care | Payer: Medicare Other | Source: Ambulatory Visit | Attending: Internal Medicine | Admitting: Internal Medicine

## 2018-09-17 DIAGNOSIS — Z1231 Encounter for screening mammogram for malignant neoplasm of breast: Secondary | ICD-10-CM

## 2018-09-17 DIAGNOSIS — Z87898 Personal history of other specified conditions: Secondary | ICD-10-CM

## 2018-09-17 DIAGNOSIS — N6314 Unspecified lump in the right breast, lower inner quadrant: Secondary | ICD-10-CM | POA: Diagnosis not present

## 2018-09-17 DIAGNOSIS — N6320 Unspecified lump in the left breast, unspecified quadrant: Secondary | ICD-10-CM

## 2018-09-17 DIAGNOSIS — N6313 Unspecified lump in the right breast, lower outer quadrant: Secondary | ICD-10-CM | POA: Diagnosis not present

## 2018-09-18 ENCOUNTER — Other Ambulatory Visit: Payer: Self-pay | Admitting: Internal Medicine

## 2018-09-18 DIAGNOSIS — N6310 Unspecified lump in the right breast, unspecified quadrant: Secondary | ICD-10-CM

## 2018-09-18 DIAGNOSIS — R928 Other abnormal and inconclusive findings on diagnostic imaging of breast: Secondary | ICD-10-CM

## 2018-09-19 ENCOUNTER — Ambulatory Visit
Admission: RE | Admit: 2018-09-19 | Discharge: 2018-09-19 | Disposition: A | Discharge status: Home / Self Care | Payer: Medicare Other | Source: Ambulatory Visit | Attending: Internal Medicine | Admitting: Internal Medicine

## 2018-09-19 DIAGNOSIS — N6310 Unspecified lump in the right breast, unspecified quadrant: Secondary | ICD-10-CM | POA: Insufficient documentation

## 2018-09-19 DIAGNOSIS — R928 Other abnormal and inconclusive findings on diagnostic imaging of breast: Secondary | ICD-10-CM | POA: Diagnosis not present

## 2018-09-19 HISTORY — PX: BREAST BIOPSY: SHX20

## 2018-09-22 ENCOUNTER — Telehealth: Payer: Self-pay

## 2018-09-25 LAB — SURGICAL PATHOLOGY

## 2018-09-26 ENCOUNTER — Telehealth: Payer: Self-pay | Admitting: Genetic Counselor

## 2018-09-26 ENCOUNTER — Other Ambulatory Visit: Payer: Self-pay | Admitting: Genetic Counselor

## 2018-09-26 ENCOUNTER — Encounter: Payer: Self-pay | Admitting: Genetic Counselor

## 2018-09-26 DIAGNOSIS — C801 Malignant (primary) neoplasm, unspecified: Secondary | ICD-10-CM

## 2018-09-26 NOTE — Telephone Encounter (Signed)
I received a message from Tanya Nones that Dr. Lysle Pearl is referring Sandra Hubbard for genetic counseling due to a history of ovarian cancer and needing genetic testing coordinated due to recent abnormal findings on a breast biopsy.  I left her a message to call and schedule this telegenetics visit to be done by phone at her convenience.   Steele Berg, [place], [place] Genetic Counselor Phone: [phone]

## 2018-09-26 NOTE — Telephone Encounter (Signed)
Cancer Genetics            Telegenetics Initial Visit    Patient Name: Sandra Hubbard Patient DOB: [birth year] Patient Age: 78 y.o. Phone Call Date: 09/26/2018  Referring Provider: Benjamine Sprague , MD  Reason for Visit: Evaluate for hereditary susceptibility to cancer    Assessment and Plan:  . Sandra Hubbard's personal history is not very clear. She was not sure whether she had uterine or ovarian cancer. While her family history is not suggestive of a hereditary predisposition to cancer, one significant limitation is that her father had only one brother. This paucity of women makes risk assessment challenging.    . Testing is available to determine whether she has a pathogenic mutation that will impact her screening and risk-reduction for cancer. A negative result will be reassuring.  . Ms. Cost wished to pursue genetic testing, and will be called to schedule a blood draw at [facility]. schedule a blood draw. Her sample will be sent to [facility] for analysis. [facility]'s STAT breast panel was requested as it will impact surgical decisions. Results should be available in about 7-12 days. The 9 genes on this panel are ATM, BRCA1, BRCA2, CDH1, CHEK2, PALB2, PTEN, STK11, TP53. Once this test is complete, analysis of additional genes on a larger hereditary cancer panel will proceed. She will be called after each result is obtained.   Dr. Grayland Ormond was available for questions concerning this case. Total time spent by counseling by phone was approximately 20 minutes.   _____________________________________________________________________   History of Present Illness: Sandra Hubbard, a 78 y.o. female, was referred for genetic counseling to discuss the possibility of a hereditary predisposition to cancer and discuss whether genetic testing is warranted. This was a telegenetics visit via phone.  Sandra Hubbard reported that she had a TAH/BSO in 1999 and was incidentally  found to have "2 small tumors; one on the ovary and one in the uterus". She said she could not remember if this was called ovarian or uterine cancer or possibly both. She indicated that she did not get offered any other treatment like chemotherapy or radiation.  She recently had a breast biopsy. That report was not available for review today, but she is supposed to undergo an excisional biopsy with Dr. Lysle Pearl.  Past Medical History:  Diagnosis Date  . Chronic anemia   . DJD (degenerative joint disease)   . Edema of both legs   . Hyperlipidemia, mixed   . Hypertension   . Osteoarthritis of both knees   . Ovarian cancer ([place])   . Rhinitis, allergic   . RLS (restless legs syndrome)   . Sciatica   . Uterine cancer [facility])     Past Surgical History:  Procedure Laterality Date  . ABDOMINAL HYSTERECTOMY    . BREAST BIOPSY Left 04/02/2017   2 area STROMAL FIBROSIS. NEGATIVE FOR ATYPIA  . BREAST BIOPSY Right 09/19/2018   [place] bx heart marker   path pending  . COLONOSCOPY N/A 12/21/2014   Procedure: COLONOSCOPY;  Surgeon: Josefine Class, MD;  Location: [facility] ENDOSCOPY;  Service: Endoscopy;  Laterality: N/A;  . DILATION AND CURETTAGE, DIAGNOSTIC / THERAPEUTIC    . ESOPHAGOGASTRODUODENOSCOPY N/A 12/21/2014   Procedure: ESOPHAGOGASTRODUODENOSCOPY (EGD);  Surgeon: Josefine Class, MD;  Location: [facility] ENDOSCOPY;  Service: Endoscopy;  Laterality: N/A;  . ESOPHAGOGASTRODUODENOSCOPY N/A 01/27/2015   Procedure: ESOPHAGOGASTRODUODENOSCOPY (EGD);  Surgeon: Josefine Class, MD;  Location: [facility] ENDOSCOPY;  Service: Endoscopy;  Laterality: N/A;  . ESOPHAGOGASTRODUODENOSCOPY (EGD) WITH PROPOFOL N/A 06/07/2017   Procedure: ESOPHAGOGASTRODUODENOSCOPY (EGD) WITH PROPOFOL;  Surgeon: Toledo, Benay Pike, MD;  Location: [facility] ENDOSCOPY;  Service: Gastroenterology;  Laterality: N/A;  . TONSILLECTOMY      Family History: There is no family history of cancer reported, but her paternal family history is very  limited. Her father had only one brother and there is no information about him. Her mother died in her 36s, cancer-free. Her mother had 3 sisters.  Ms. Schlack ancestry is Caucasian - NOS. There is no known Jewish ancestry and no consanguinity.  Discussion: We reviewed the characteristics, features and inheritance patterns of hereditary cancer syndromes. We discussed her risk of harboring a mutation in the context of her personal and family history. We discussed that her small paternal family and paucity of women makes risk assessment challenging. We discussed the process of genetic testing, insurance coverage and implications of results: positive, negative and variant of uncertain significance (VUS).   Ms. Korf questions were answered to her satisfaction today and she is welcome to call with any additional questions or concerns. Thank you for the referral and allowing [place] to share in the care of your patient.    Steele Berg, MS, [place] Certified Genetic Counselor phone: [phone]

## 2018-09-30 ENCOUNTER — Encounter: Payer: Self-pay | Admitting: *Deleted

## 2018-09-30 NOTE — Progress Notes (Signed)
  Oncology Nurse Navigator Documentation  Navigator Location: [facility] (09/30/18 0900)   )                          Barriers/Navigation Needs: Coordination of Care (09/30/18 0900)                          Time Spent with Patient: 15 (09/30/18 0900)   Patient is scheduled for blood draw for genetic testing tomorrow.  I called and notified her of her appointment.  Informed Vicky, Dr. Ines Bloomer nurse of patient's appointment.  Will notify Dr. Lysle Pearl when results are available.

## 2018-10-01 ENCOUNTER — Inpatient Hospital Stay: Payer: Medicare Other | Attending: Internal Medicine

## 2018-10-08 ENCOUNTER — Telehealth: Payer: Self-pay | Admitting: Genetic Counselor

## 2018-10-08 NOTE — Telephone Encounter (Signed)
Cancer Genetics             Telegenetics Results Disclosure   Patient Name: Sandra Hubbard Patient DOB: March 01, 1941 Patient Age: 78 y.o. Phone Call Date: 10/08/2018  Referring Provider: Benjamine Sprague , MD    Ms. Swanner was called today to discuss genetic test results. Please see the Genetics telephone note from 09/26/18 for a detailed discussion of her personal and family histories and the recommendations provided.  Genetic Testing: At the time of Ms. Gaby's telegenetics visit, she decided to pursue genetic testing of 9 genes that may be used to help guide surgical decisions. Once that test was completed, additional genes on a larger panel were analyzed. Both results were available on the same day. Testing which included sequencing and deletion/duplication analysis. Testing did not reveal a pathogenic mutation in any of the genes analyzed. A copy of the genetic test report will be scanned into Epic under the Media tab.  A Variant of Uncertain Significance was detected: MSH6 c.251C>T (p.Ala84Val). This is still considered a normal result. While at this time, it is unknown if this finding is associated with increased cancer risk, the majority of these variants get reclassified to be inconsequential. Medical management should not be based on this finding. The lab will eventually determine the significance, if any. If it gets reclassified by the lab, she will receive a new report via their secure portal, by secure email, or by mail. Ms. Melder should stay in contact with [place] on a yearly basis if she is interested in knowing the status of this result and keep her address and phone number up to date in the system.  Interpretation: These results are reassuring and indicate that Ms. Longnecker does not likely have an increased risk of cancer due to a mutation in one of these genes.   Since the current test is not perfect, it is possible that there may be a gene mutation  that current testing cannot detect, but that chance is small. It is possible that a different genetic factor, which has not yet been discovered or is not on this panel, is responsible for the cancer diagnoses in the family. Again, the likelihood of this is low. No additional testing is recommended at this time for Ms. Hotz.  Genes Analyzed: The genes analyzed were the 84 genes on [facility]'s Multi-Cancer panel (AIP, ALK, APC, ATM, AXIN2, BAP1, BARD1, BLM, BMPR1A, BRCA1, BRCA2, BRIP1, CASR, CDC73, CDH1, CDK4, CDKN1B, CDKN1C, CDKN2A, CEBPA, CHEK2, CTNNA1, DICER1, DIS3L2, EGFR, EPCAM, FH, FLCN, GATA2, GPC3, GREM1, HOXB13, HRAS, KIT, MAX, MEN1, MET, MITF, MLH1, MSH2, MSH3, MSH6, MUTYH, NBN, NF1, NF2, NTHL1, PALB2, PDGFRA, PHOX2B, PMS2, POLD1, POLE, POT1, PRKAR1A, PTCH1, PTEN, RAD50, RAD51C, RAD51D, RB1, RECQL4, RET, RUNX1, SDHA, SDHAF2, SDHB, SDHC, SDHD, SMAD4, SMARCA4, SMARCB1, SMARCE1, STK11, SUFU, TERC, TERT, TMEM127, TP53, TSC1, TSC2, VHL, WRN, WT1).  Cancer Screening: Ms. Casciano is recommended to follow the cancer screening guidelines provided by her physicians.   Family Members: Family members may be at some increased risk of developing cancer, over the general population risk, simply due to the family history. They are recommended to speak with their own providers about appropriate cancer screenings.   Any relative who had cancer at a young age or had a particularly rare cancer may also wish to pursue genetic testing. Genetic counselors can be located in other cities, by visiting the  website of the [employer] ([email or web address]) and Field seismologist for a Dietitian by zip code.   Family members are not recommended to get tested for the above VUS outside of a research protocol as this finding has no implications for their medical management.    Follow-Up: Cancer genetics is a rapidly advancing field and it is possible that new genetic tests will be appropriate for Ms.  Rohde in the future. Ms. Weatherman is encouraged to remain in contact with Genetics on an annual basis so we can update her personal and family histories, and let her know of advances in cancer genetics that may benefit the family. Ms. Mcevers questions were answered to her satisfaction today, and she knows she is welcome to call anytime with additional questions.    Steele Berg, MS, [place] Certified Genetic Counselor phone: [phone]

## 2019-01-22 ENCOUNTER — Other Ambulatory Visit: Payer: Self-pay | Admitting: Surgery

## 2019-01-22 DIAGNOSIS — D241 Benign neoplasm of right breast: Secondary | ICD-10-CM

## 2019-01-26 ENCOUNTER — Other Ambulatory Visit: Payer: Self-pay | Admitting: Surgery

## 2019-01-26 DIAGNOSIS — D241 Benign neoplasm of right breast: Secondary | ICD-10-CM

## 2019-03-03 ENCOUNTER — Encounter: Payer: Self-pay | Admitting: Oncology

## 2019-03-20 ENCOUNTER — Other Ambulatory Visit: Payer: TRICARE For Life (TFL)

## 2019-04-03 ENCOUNTER — Ambulatory Visit
Admission: RE | Admit: 2019-04-03 | Discharge: 2019-04-03 | Disposition: A | Discharge status: Home / Self Care | Payer: Medicare Other | Source: Ambulatory Visit | Attending: Surgery | Admitting: Surgery

## 2019-04-03 DIAGNOSIS — D241 Benign neoplasm of right breast: Secondary | ICD-10-CM

## 2019-05-10 IMAGING — MG MM DIGITAL DIAGNOSTIC UNILAT*L* W/ TOMO W/ CAD
6 of 9 series · 6 of 21 positions shown · non-contrast
Comparison: Screening mammograms dated 10/19/2014 and 02/12/2017

ADDENDUM:
The patient's outside prior mammograms from January 21, 2012,
01/02/2011, 11/15/2009, 11/03/2009, 10/13/2008, 08/12/2007,
07/30/2007, 09/23/2006, 03/26/2006, and 02/26/2006 have been
obtained and comparison made. The possible asymmetry identified on
screening mammogram within the subareolar left breast was likely
present on prior exams but is more prominent on the recent study.
Ultrasound-guided biopsy of the intraductal debris versus mass at 12
o'clock, 1 cm from the nipple and of the intraductal debris versus
mass at 1 o'clock, 1 cm from the nipple is recommended.
CLINICAL DATA: Callback from screening mammogram

EXAM:
2D DIGITAL DIAGNOSTIC LEFT MAMMOGRAM WITH CAD AND ADJUNCT TOMO
ULTRASOUND LEFT BREAST

[L CC synth-2D (1 of 2)]
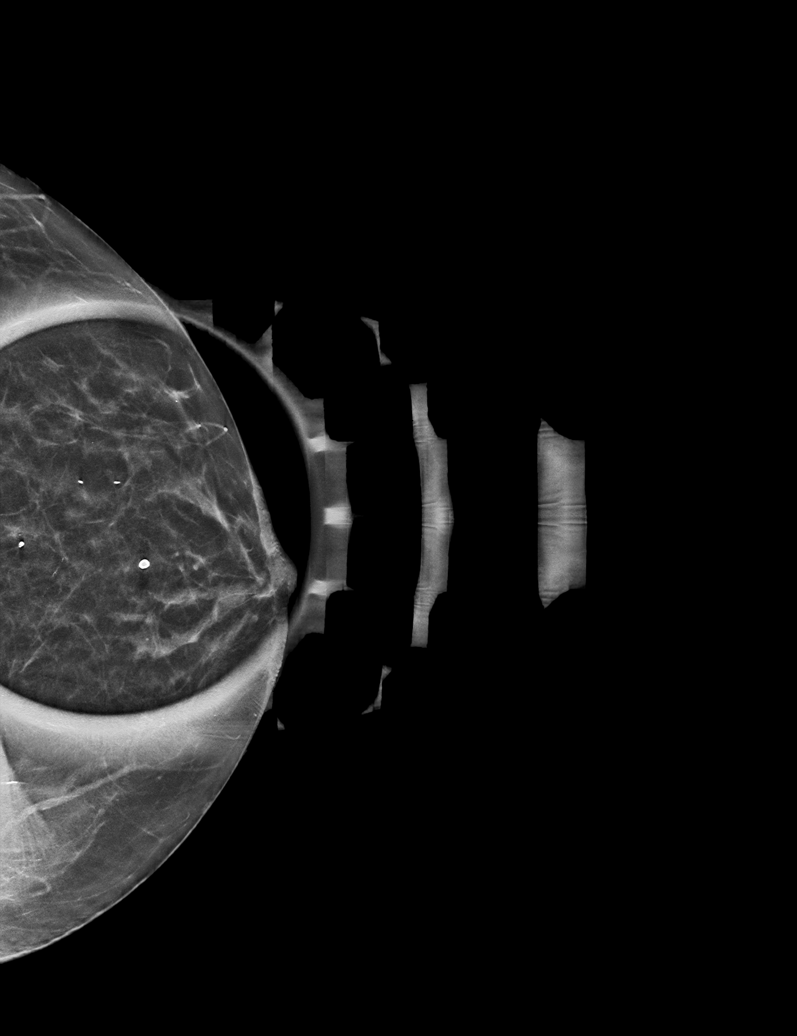

[L MLO]
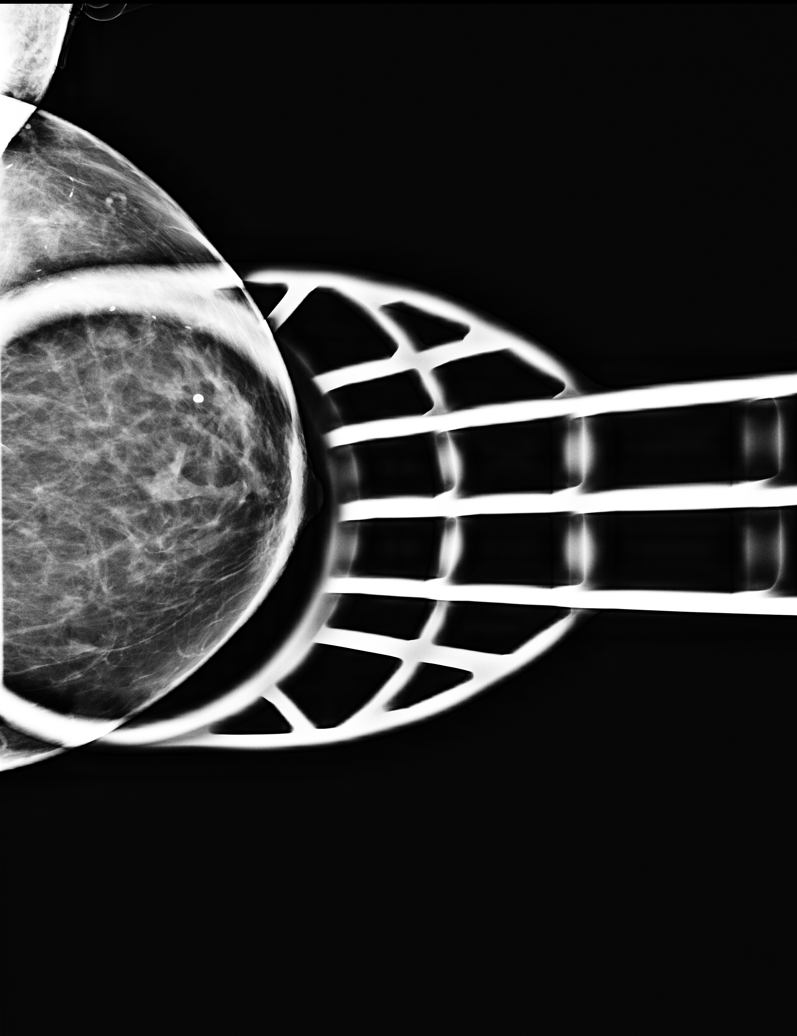

[L CC (1 of 2)]
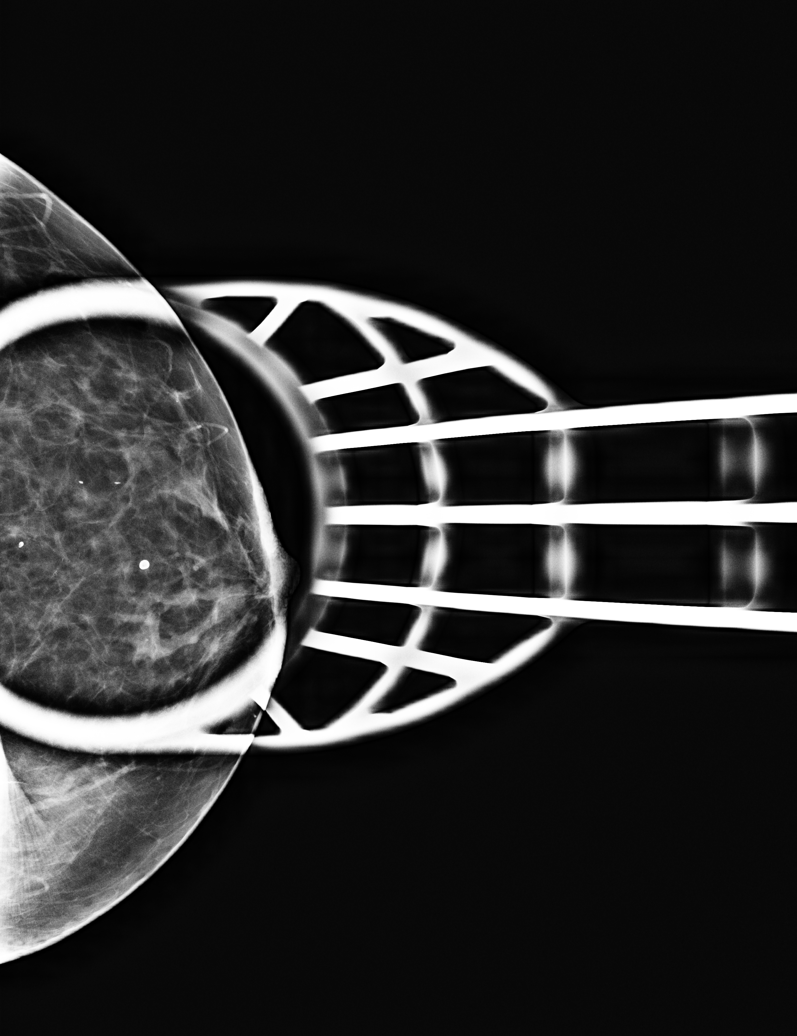

[L MLO synth-2D]
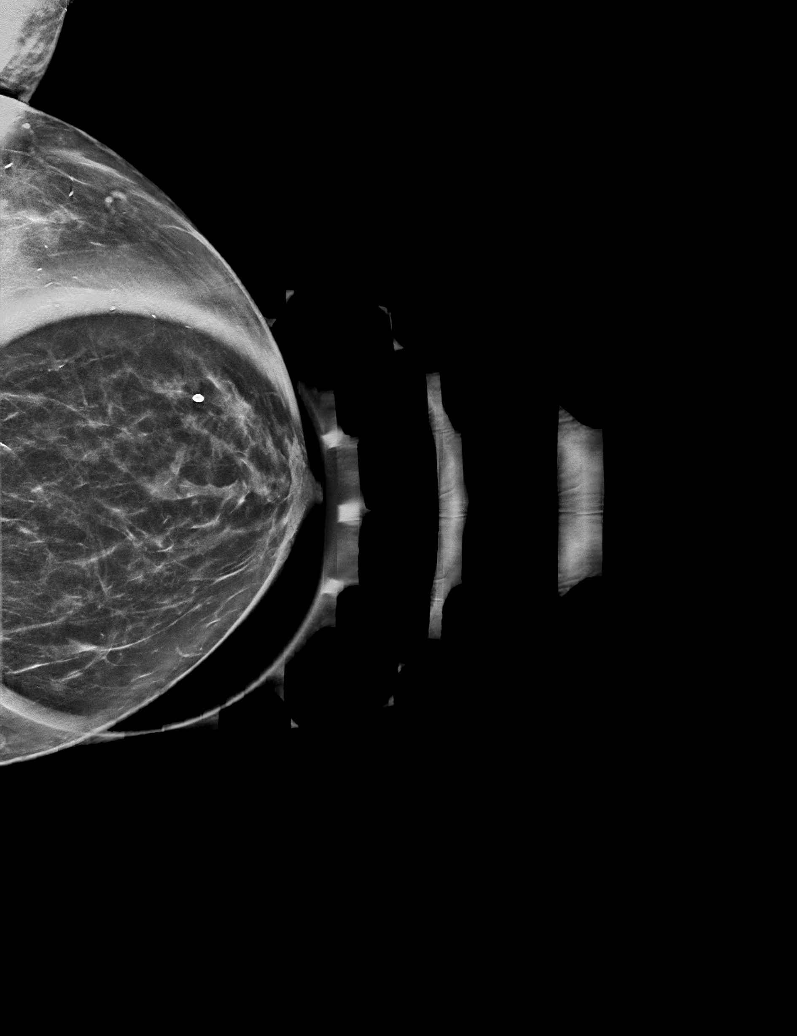

[L CC (2 of 2)]
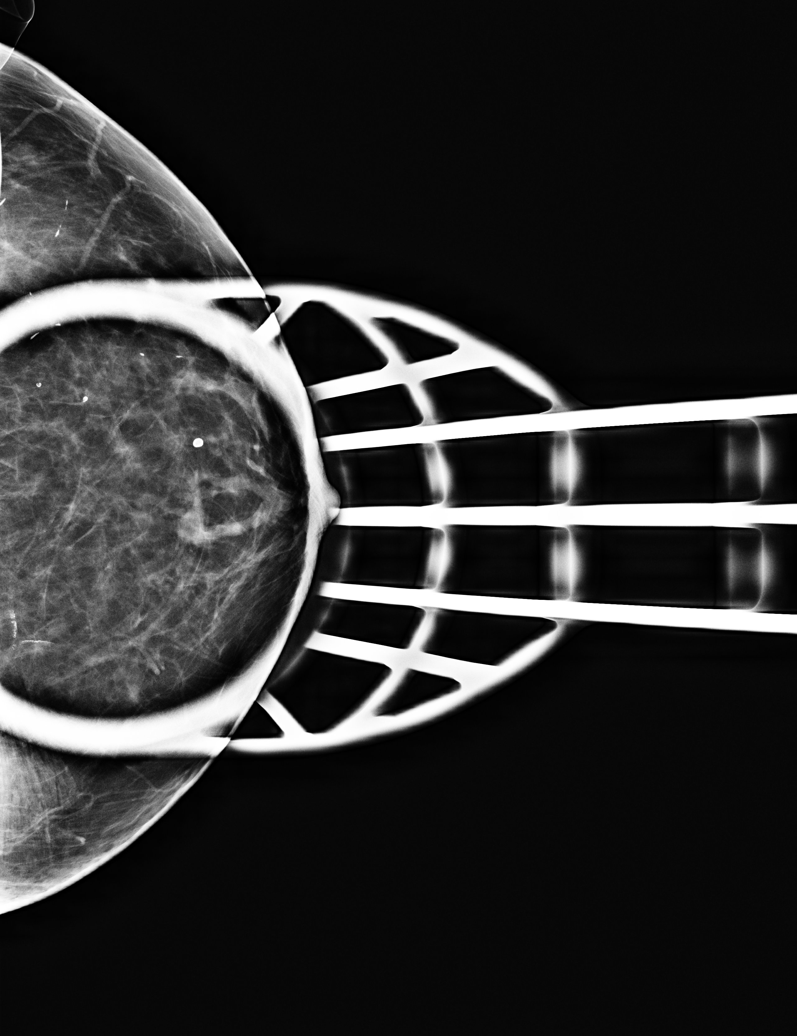

[L CC synth-2D (2 of 2)]
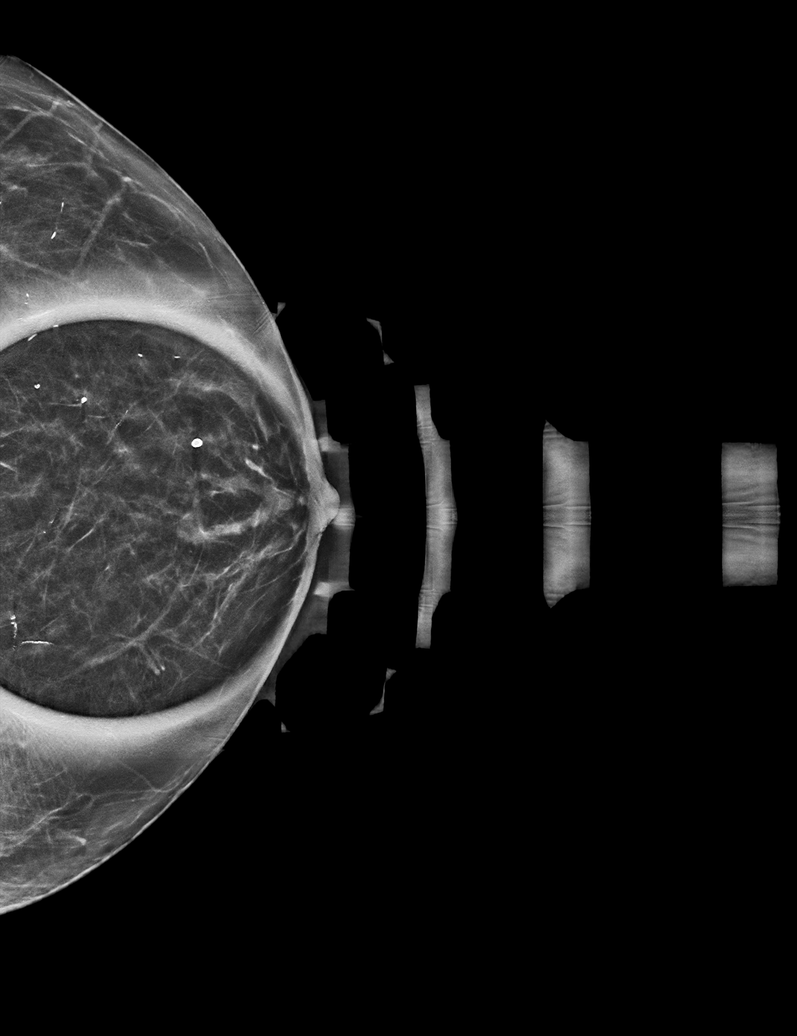

[6 of 21 positions shown; findings below may reference images not displayed]

IMPRESSION: 1. Intraductal debris versus mass at 12 o'clock, 1 cm from the
nipple.

2. Intraductal debris versus mass at 1 o'clock, 1 cm from the
nipple.

RECOMMENDATION:

Ultrasound-guided biopsy of the intraductal debris versus mass at 12
o'clock, 1 cm from the nipple and of the intraductal debris versus
mass at 1 o'clock, 1 cm from the nipple.

BI-RADS CATEGORY 4: Suspicious.
ACR Breast Density Category b: There are scattered areas of
fibroglandular density.
FINDINGS: The possible asymmetry identified within the subareolar left breast
persists on today's spot compression views.

Mammographic images were processed with CAD.

On physical exam, no discrete mass is felt in the area of concern
within the subareolar left breast. The patient denies any nipple
discharge.

Targeted ultrasound of the subareolar left breast was performed
demonstrating an 8 x 4 x 6 mm area of intraductal echogenic material
at 12 o'clock, 1 cm from the nipple. A similar-appearing area was
identified at 1 o'clock, 1 cm from the nipple measuring 6 x 3 x 3
mm. No internal vascularity was identified within either area.
IMPRESSION: Incomplete evaluation.

RECOMMENDATION:
Attempt will be made to obtain the patient's older outside prior
mammograms and comparison made. An addendum will be made at that
time.

I have discussed the findings and recommendations with the patient.
Results were also provided in writing at the conclusion of the
visit. If applicable, a reminder letter will be sent to the patient
regarding the next appointment.

BI-RADS CATEGORY  0: Incomplete. Need additional imaging evaluation
and/or prior mammograms for comparison.

## 2019-06-24 ENCOUNTER — Encounter (INDEPENDENT_AMBULATORY_CARE_PROVIDER_SITE_OTHER): Payer: Medicare Other

## 2019-06-24 ENCOUNTER — Ambulatory Visit (INDEPENDENT_AMBULATORY_CARE_PROVIDER_SITE_OTHER): Payer: Medicare Other | Admitting: Vascular Surgery

## 2019-08-27 ENCOUNTER — Other Ambulatory Visit: Payer: Self-pay | Admitting: Surgery

## 2019-08-27 DIAGNOSIS — D241 Benign neoplasm of right breast: Secondary | ICD-10-CM

## 2019-08-27 DIAGNOSIS — N6310 Unspecified lump in the right breast, unspecified quadrant: Secondary | ICD-10-CM

## 2019-09-24 ENCOUNTER — Ambulatory Visit
Admission: RE | Admit: 2019-09-24 | Discharge: 2019-09-24 | Disposition: A | Discharge status: Home / Self Care | Payer: Medicare Other | Source: Ambulatory Visit | Attending: Surgery | Admitting: Surgery

## 2019-09-24 DIAGNOSIS — D241 Benign neoplasm of right breast: Secondary | ICD-10-CM

## 2019-12-14 ENCOUNTER — Other Ambulatory Visit (INDEPENDENT_AMBULATORY_CARE_PROVIDER_SITE_OTHER): Payer: Self-pay | Admitting: Vascular Surgery

## 2019-12-14 DIAGNOSIS — I890 Lymphedema, not elsewhere classified: Secondary | ICD-10-CM

## 2020-06-02 ENCOUNTER — Other Ambulatory Visit: Admission: RE | Admit: 2020-06-02 | Payer: TRICARE For Life (TFL) | Source: Ambulatory Visit

## 2020-06-06 ENCOUNTER — Ambulatory Visit: Admit: 2020-06-06 | Payer: TRICARE For Life (TFL) | Admitting: Internal Medicine

## 2020-06-06 SURGERY — COLONOSCOPY
Anesthesia: General

## 2020-12-20 ENCOUNTER — Encounter: Payer: Self-pay | Admitting: Internal Medicine

## 2020-12-21 ENCOUNTER — Ambulatory Visit: Payer: Medicare Other | Admitting: Certified Registered Nurse Anesthetist

## 2020-12-21 ENCOUNTER — Encounter
Admission: RE | Disposition: A | Discharge status: Home / Self Care | Payer: Self-pay | Source: Home / Self Care | Attending: Internal Medicine

## 2020-12-21 ENCOUNTER — Encounter: Payer: Self-pay | Admitting: Internal Medicine

## 2020-12-21 ENCOUNTER — Ambulatory Visit
Admission: RE | Admit: 2020-12-21 | Discharge: 2020-12-21 | Disposition: A | Discharge status: Home / Self Care | Payer: Medicare Other | Attending: Internal Medicine | Admitting: Internal Medicine

## 2020-12-21 DIAGNOSIS — Z1211 Encounter for screening for malignant neoplasm of colon: Secondary | ICD-10-CM | POA: Diagnosis not present

## 2020-12-21 DIAGNOSIS — Z7982 Long term (current) use of aspirin: Secondary | ICD-10-CM | POA: Diagnosis not present

## 2020-12-21 DIAGNOSIS — D122 Benign neoplasm of ascending colon: Secondary | ICD-10-CM | POA: Insufficient documentation

## 2020-12-21 DIAGNOSIS — K640 First degree hemorrhoids: Secondary | ICD-10-CM | POA: Diagnosis not present

## 2020-12-21 DIAGNOSIS — Z8543 Personal history of malignant neoplasm of ovary: Secondary | ICD-10-CM | POA: Insufficient documentation

## 2020-12-21 DIAGNOSIS — Z888 Allergy status to other drugs, medicaments and biological substances status: Secondary | ICD-10-CM | POA: Insufficient documentation

## 2020-12-21 DIAGNOSIS — Z8542 Personal history of malignant neoplasm of other parts of uterus: Secondary | ICD-10-CM | POA: Insufficient documentation

## 2020-12-21 DIAGNOSIS — Z79899 Other long term (current) drug therapy: Secondary | ICD-10-CM | POA: Insufficient documentation

## 2020-12-21 DIAGNOSIS — K5730 Diverticulosis of large intestine without perforation or abscess without bleeding: Secondary | ICD-10-CM | POA: Insufficient documentation

## 2020-12-21 HISTORY — DX: Gastro-esophageal reflux disease without esophagitis: K21.9

## 2020-12-21 HISTORY — PX: COLONOSCOPY WITH PROPOFOL: SHX5780

## 2020-12-21 HISTORY — DX: Residual hemorrhoidal skin tags: K64.4

## 2020-12-21 HISTORY — DX: Sleep apnea, unspecified: G47.30

## 2020-12-21 HISTORY — DX: Polyp of stomach and duodenum: K31.7

## 2020-12-21 HISTORY — DX: Gastritis, unspecified, without bleeding: K29.70

## 2020-12-21 HISTORY — DX: Gastric ulcer, unspecified as acute or chronic, without hemorrhage or perforation: K25.9

## 2020-12-21 SURGERY — COLONOSCOPY WITH PROPOFOL
Anesthesia: General

## 2020-12-21 MED ORDER — GLYCOPYRROLATE 0.2 MG/ML IJ SOLN
INTRAMUSCULAR | Status: DC | PRN
  Administered 2020-12-21: .2 mg via INTRAVENOUS

## 2020-12-21 MED ORDER — PROPOFOL 10 MG/ML IV BOLUS
INTRAVENOUS | Status: DC | PRN
  Administered 2020-12-21: 70 mg via INTRAVENOUS

## 2020-12-21 MED ORDER — LIDOCAINE HCL (CARDIAC) PF 100 MG/5ML IV SOSY
PREFILLED_SYRINGE | INTRAVENOUS | Status: DC | PRN
  Administered 2020-12-21: 100 mg via INTRAVENOUS

## 2020-12-21 MED ORDER — PROPOFOL 500 MG/50ML IV EMUL
INTRAVENOUS | Status: DC | PRN
  Administered 2020-12-21: 160 ug/kg/min via INTRAVENOUS

## 2020-12-21 MED ORDER — SODIUM CHLORIDE 0.9 % IV SOLN: INTRAVENOUS | Status: DC

## 2020-12-21 MED ORDER — PHENYLEPHRINE HCL (PRESSORS) 10 MG/ML IV SOLN
INTRAVENOUS | Status: DC | PRN
  Administered 2020-12-21: 100 ug via INTRAVENOUS

## 2020-12-21 NOTE — Interval H&P Note (Signed)
History and Physical Interval Note:  12/21/2020 1:59 PM  Sandra Hubbard  has presented today for surgery, with the diagnosis of HX ADEN POLYPS.  The various methods of treatment have been discussed with the patient and family. After consideration of risks, benefits and other options for treatment, the patient has consented to  Procedure(s): COLONOSCOPY WITH PROPOFOL (N/A) as a surgical intervention.  The patient's history has been reviewed, patient examined, no change in status, stable for surgery.  I have reviewed the patient's chart and labs.  Questions were answered to the patient's satisfaction.     [place], [place]

## 2020-12-21 NOTE — H&P (Signed)
Outpatient short stay form Pre-procedure 12/21/2020 1:16 PM [name] K. Alice Reichert, M.D.  Primary Physician: [name], M.D.  Reason for visit:  Personal history of serrated adenoma of the colon (2016).  History of present illness:                          Patient presents for colonoscopy for a personal hx of colon polyps. The patient denies abdominal pain, abnormal weight loss or rectal bleeding.  Sandra Hubbard is a 80 y.o. female who presents to the [facility] for follow up to reschedule surveillance colonoscopy. Pt was last seen in clinic 03/2020 and scheduled for surveillance colonoscopy, but this was cancelled by patient.   Brief History   Sandra Hubbard is established with [facility] for the management of [facility]. All prior office notes, lab results, imaging studies, and procedure reports reviewed as appropriate.  Summary of evaluation:  - CSY: 12/21/2014 - 5 mm sessile serrated adenoma in ascending colon - EGD: 12/21/2014 - medium-sized hiatal hernia, GEJ polyps with path showing squamous papilloma, two non-bleeding superficial gastric ulcers in antrum (largest lesion 4 mm in largest dimension), duodenal biopsies negative  - EGD: 01/27/2015 - single 3 mm squamous papilloma polyp at GEJ removed, small hiatal hernia, mild gastriits, single 4 mm polyp in second part of duodenum with neg biopsies - EGD: 06/07/2017 - white nummular lesions in entire esophagus with benign squamous mucosa, erythematous mucosa in antrum, normal duodenum      No current facility-administered medications for this encounter.  Current Outpatient Medications:  .  calcium carbonate (OS-CAL) 1250 (500 Ca) MG chewable tablet, Chew 1 tablet by mouth daily., Disp: , Rfl:  .  diclofenac Sodium (VOLTAREN) 1 % GEL, Apply topically 4 (four) times daily., Disp: , Rfl:  .  hydrochlorothiazide (HYDRODIURIL) 12.5 MG tablet, Take 12.5 mg by mouth daily., Disp: , Rfl:  .  losartan (COZAAR)  100 MG tablet, Take 100 mg by mouth daily., Disp: , Rfl:  .  olopatadine (PATANOL) 0.1 % ophthalmic solution, 1 drop 2 (two) times daily., Disp: , Rfl:  .  acetaminophen (TYLENOL) 650 MG CR tablet, Take 1,300 mg by mouth every 8 (eight) hours as needed for pain., Disp: , Rfl:  .  aspirin 81 MG EC tablet, Take 81 mg by mouth daily., Disp: , Rfl: 3 .  atenolol (TENORMIN) 50 MG tablet, Take 50 mg by mouth daily., Disp: , Rfl: 3 .  cetirizine (ZYRTEC) 10 MG tablet, Take 10 mg by mouth daily., Disp: , Rfl:  .  Cholecalciferol (VITAMIN D) 2000 UNITS CAPS, Take 1 capsule by mouth 2 (two) times daily., Disp: , Rfl:  .  co-enzyme Q-10 30 MG capsule, Take 30 mg by mouth daily. , Disp: , Rfl:  .  ferrous sulfate 325 (65 FE) MG tablet, Take 325 mg by mouth daily with breakfast., Disp: , Rfl:  .  fexofenadine (ALLEGRA) 180 MG tablet, Take 180 mg by mouth daily., Disp: , Rfl:  .  fluticasone (FLONASE) 50 MCG/ACT nasal spray, Place 2 sprays into both nostrils daily., Disp: , Rfl: 12 .  furosemide (LASIX) 40 MG tablet, Take 40 mg by mouth daily., Disp: , Rfl:  .  glucosamine-chondroitin 500-400 MG tablet, Take 2 tablets by mouth 2 (two) times daily., Disp: , Rfl:  .  KLOR-CON 10 10 MEQ tablet, Take 10 mEq by mouth daily., Disp: , Rfl: 3 .  lisinopril-hydrochlorothiazide (PRINZIDE,ZESTORETIC) 20-12.5 MG per tablet, Take  2 tablets by mouth daily., Disp: , Rfl: 3 .  Lorcaserin HCl 10 MG TABS, Take 10 mg by mouth., Disp: , Rfl:  .  magnesium oxide (MAG-OX) 400 MG tablet, Take 400 mg by mouth 2 (two) times daily., Disp: , Rfl:  .  Misc Natural Products (LEG VEIN & CIRCULATION PO), Take 1 tablet by mouth 2 (two) times daily., Disp: , Rfl:  .  Multiple Vitamins-Minerals (MULTIVITAMIN WITH MINERALS) tablet, Take 2-3 tablets by mouth 2 (two) times daily. 3 tab in the morning and 2 in the afternoon, Disp: , Rfl:  .  Omega-3 Fatty Acids (OMEGA III EPA+DHA PO), Take 1 tablet by mouth 2 (two) times daily., Disp: , Rfl:  .   omeprazole (PRILOSEC) 40 MG capsule, Take 40 mg by mouth daily., Disp: , Rfl:  .  potassium chloride (K-DUR,KLOR-CON) 10 MEQ tablet, Take 10 mEq by mouth daily., Disp: , Rfl:  .  SUPREP BOWEL PREP SOLN, Take 1 each by mouth once., Disp: , Rfl: 0 .  traMADol (ULTRAM) 50 MG tablet, Take by mouth every 6 (six) hours as needed., Disp: , Rfl:   No medications prior to admission.     Allergies  Allergen Reactions  . Maxzide [Hydrochlorothiazide W-Triamterene] Other (See Comments)    unk  . Donnatal [Pb-Hyoscy-Atropine-Scopolamine] Itching and Rash     Past Medical History:  Diagnosis Date  . Chronic anemia   . DJD (degenerative joint disease)   . Edema of both legs   . External hemorrhoid   . Gastric polyps   . Gastric ulcer   . Gastritis   . GERD (gastroesophageal reflux disease)   . Hyperlipidemia, mixed   . Hypertension   . Osteoarthritis of both knees   . Ovarian cancer ([place])   . Rhinitis, allergic   . RLS (restless legs syndrome)   . Sciatica   . Sleep apnea   . Uterine cancer ([place])     Review of systems:  Otherwise negative.    Physical Exam  Gen: Alert, oriented. Appears stated age.  HEENT: [place]/AT. PERRLA. Lungs: CTA, no wheezes. CV: RR nl S1, S2. Abd: soft, benign, no masses. BS+ Ext: No edema. Pulses 2+    Planned procedures: Proceed with colonoscopy. The patient understands the nature of the planned procedure, indications, risks, alternatives and potential complications including but not limited to bleeding, infection, perforation, damage to internal organs and possible oversedation/side effects from anesthesia. The patient agrees and gives consent to proceed.  Please refer to procedure notes for findings, recommendations and patient disposition/instructions.     Sandra Hubbard K. Alice Reichert, M.D. Gastroenterology 12/21/2020  1:16 PM

## 2020-12-21 NOTE — Op Note (Signed)
[facility]astroenterology Patient Name: Sandra Hubbard Procedure Date: 12/21/2020 2:46 PM MRN: [id number] Account #: [id number] Date of Birth: 07-22-1941 Admit Type: Outpatient Age: 80 Room: [facility] ENDO ROOM 2 Gender: Female Note Status: Finalized Procedure:             Colonoscopy Indications:           Surveillance: Personal history of adenomatous polyps                         on last colonoscopy > 5 years ago Providers:             Lorie Apley K. Alice Reichert MD, MD Referring MD:          Ocie Cornfield. Ouida Sills MD, MD (Referring MD) Medicines:             Propofol per Anesthesia Complications:         No immediate complications. Procedure:             Pre-Anesthesia Assessment:                        - The risks and benefits of the procedure and the                         sedation options and risks were discussed with the                         patient. All questions were answered and informed                         consent was obtained.                        - Patient identification and proposed procedure were                         verified prior to the procedure by the nurse. The                         procedure was verified in the procedure room.                        - ASA Grade Assessment: III - A patient with severe                         systemic disease.                        - After reviewing the risks and benefits, the patient                         was deemed in satisfactory condition to undergo the                         procedure.                        After obtaining informed consent, the colonoscope was                         passed under  direct vision. Throughout the procedure,                         the patient's blood pressure, pulse, and oxygen                         saturations were monitored continuously. The                         Colonoscope was introduced through the anus and                         advanced to the the cecum,  identified by appendiceal                         orifice and ileocecal valve. The colonoscopy was                         performed without difficulty. The patient tolerated                         the procedure well. The quality of the bowel                         preparation was adequate. The ileocecal valve,                         appendiceal orifice, and rectum were photographed. Findings:      The perianal and digital rectal examinations were normal. Pertinent       negatives include normal sphincter tone and no palpable rectal lesions.      Non-bleeding internal hemorrhoids were found during retroflexion. The       hemorrhoids were Grade I (internal hemorrhoids that do not prolapse).      A few medium-mouthed diverticula were found in the sigmoid colon.      A 5 mm polyp was found in the proximal ascending colon. The polyp was       sessile. The polyp was removed with a jumbo cold forceps. Resection and       retrieval were complete.      The exam was otherwise without abnormality. Impression:            - Non-bleeding internal hemorrhoids.                        - Diverticulosis in the sigmoid colon.                        - One 5 mm polyp in the proximal ascending colon,                         removed with a jumbo cold forceps. Resected and                         retrieved.                        - The examination was otherwise normal. Recommendation:        - Patient has a contact number available for  emergencies. The signs and symptoms of potential                         delayed complications were discussed with the patient.                         Return to normal activities tomorrow. Written                         discharge instructions were provided to the patient.                        - Resume previous diet.                        - Continue present medications.                        - If polyps are benign or adenomatous without                          dysplasia, I will advise NO further colonoscopy due to                         advanced age and/or severe comorbidity.                        - You do NOT require further colon cancer screening                         measures (Annual stool testing (i.e. hemoccult, FIT,                         cologuard), sigmoidoscopy, colonoscopy or CT                         colonography). You should share this recommendation                         with your Primary Care provider.                        - Return to GI office PRN.                        - The findings and recommendations were discussed with                         the patient. Procedure Code(s):     --- Professional ---                        [phone], Colonoscopy, flexible; with biopsy, single or                         multiple Diagnosis Code(s):     --- Professional ---                        K57.30, Diverticulosis of large intestine without  perforation or abscess without bleeding                        K63.5, Polyp of colon                        K64.0, First degree hemorrhoids                        Z86.010, Personal history of colonic polyps CPT copyright 2019 American Medical Association. All rights reserved. The codes documented in this report are preliminary and upon coder review may  be revised to meet current compliance requirements. Efrain Sella MD, MD 12/21/2020 3:21:06 PM This report has been signed electronically. Number of Addenda: 0 Note Initiated On: 12/21/2020 2:46 PM Scope Withdrawal Time: 0 hours 6 minutes 15 seconds  Total Procedure Duration: 0 hours 11 minutes 17 seconds  Estimated Blood Loss:  Estimated blood loss: none.      [facility]

## 2020-12-21 NOTE — Anesthesia Procedure Notes (Signed)
Performed by: Demetrius Charity, CRNA Pre-anesthesia Checklist: Patient identified, Emergency Drugs available, Suction available, Timeout performed and Patient being monitored Oxygen Delivery Method: Nasal cannula Placement Confirmation: CO2 detector and positive ETCO2

## 2020-12-21 NOTE — Anesthesia Preprocedure Evaluation (Signed)
Anesthesia Evaluation  Patient identified by MRN, date of birth, ID band Patient awake    Reviewed: Allergy & Precautions, NPO status , Patient's Chart, lab work & pertinent test results  History of Anesthesia Complications Negative for: history of anesthetic complications  Airway Mallampati: II  TM Distance: >3 FB Neck ROM: Full    Dental  (+) Upper Dentures, Partial Lower, Dental Advidsory Given   Pulmonary neg pulmonary ROS, neg shortness of breath, neg sleep apnea, neg COPD, neg recent URI,    breath sounds clear to auscultation- rhonchi (-) wheezing      Cardiovascular hypertension, Pt. on medications (-) angina(-) CAD, (-) Past MI and (-) Cardiac Stents (-) dysrhythmias (-) Valvular Problems/Murmurs Rhythm:Regular Rate:Normal - Systolic murmurs and - Diastolic murmurs    Neuro/Psych neg Seizures negative neurological ROS  negative psych ROS   GI/Hepatic Neg liver ROS, PUD, GERD  Controlled and Medicated,  Endo/Other  neg diabetesMorbid obesity  Renal/GU negative Renal ROS     Musculoskeletal  (+) Arthritis ,   Abdominal (+) + obese,   Peds  Hematology  (+) Blood dyscrasia, anemia ,   Anesthesia Other Findings Past Medical History: No date: Chronic anemia No date: DJD (degenerative joint disease) No date: Edema of both legs No date: Hyperlipidemia, mixed No date: Hypertension No date: Osteoarthritis of both knees No date: Osteoarthritis of both knees No date: Ovarian cancer ([place]) No date: Ovarian cancer ([place]) No date: Rhinitis, allergic No date: RLS (restless legs syndrome) No date: Sciatica No date: Uterine cancer (H[facility]) No date: Uterine cancer ([place])   Reproductive/Obstetrics                             Anesthesia Physical  Anesthesia Plan  ASA: III  Anesthesia Plan: General   Post-op Pain Management:    Induction: Intravenous  PONV Risk Score and Plan: 3 and  Propofol infusion and TIVA  Airway Management Planned: Natural Airway and Nasal Cannula  Additional Equipment:   Intra-op Plan:   Post-operative Plan:   Informed Consent: I have reviewed the patients History and Physical, chart, labs and discussed the procedure including the risks, benefits and alternatives for the proposed anesthesia with the patient or authorized representative who has indicated his/her understanding and acceptance.     Dental advisory given  Plan Discussed with: CRNA and Anesthesiologist  Anesthesia Plan Comments:         Anesthesia Quick Evaluation

## 2020-12-21 NOTE — Transfer of Care (Signed)
Immediate Anesthesia Transfer of Care Note  Patient: Sandra Hubbard  Procedure(s) Performed: COLONOSCOPY WITH PROPOFOL (N/A )  Patient Location: PACU  Anesthesia Type:General  Level of Consciousness: awake and alert   Airway & Oxygen Therapy: Patient Spontanous Breathing  Post-op Assessment: Report given to RN and Post -op Vital signs reviewed and stable  Post vital signs: Reviewed and stable  Last Vitals:  Vitals Value Taken Time  BP 112/53 12/21/20 1520  Temp 35.7 C 12/21/20 1519  Pulse 54 12/21/20 1521  Resp 14 12/21/20 1521  SpO2 100 % 12/21/20 1521  Vitals shown include unvalidated device data.  Last Pain:  Vitals:   12/21/20 1519  TempSrc: Temporal  PainSc: Asleep         Complications: No complications documented.

## 2020-12-21 NOTE — H&P (Signed)
Outpatient short stay form Pre-procedure 12/21/2020 12:44 PM Jode Lippe K. Alice Reichert, M.D.  Primary Physician: [name], M.D.  Reason for visit: .Personal history of colon polyps.  History of present illness:  Patient is due for high-risk surveillance at this time. Last colonoscopy 12/2014 showed 5 mm SSA in ascending colon Patient denies change in bowel habits, rectal bleeding, weight loss or abdominal pain.     No current facility-administered medications for this encounter.  Current Outpatient Medications:  .  calcium carbonate (OS-CAL) 1250 (500 Ca) MG chewable tablet, Chew 1 tablet by mouth daily., Disp: , Rfl:  .  diclofenac Sodium (VOLTAREN) 1 % GEL, Apply topically 4 (four) times daily., Disp: , Rfl:  .  hydrochlorothiazide (HYDRODIURIL) 12.5 MG tablet, Take 12.5 mg by mouth daily., Disp: , Rfl:  .  losartan (COZAAR) 100 MG tablet, Take 100 mg by mouth daily., Disp: , Rfl:  .  olopatadine (PATANOL) 0.1 % ophthalmic solution, 1 drop 2 (two) times daily., Disp: , Rfl:  .  acetaminophen (TYLENOL) 650 MG CR tablet, Take 1,300 mg by mouth every 8 (eight) hours as needed for pain., Disp: , Rfl:  .  aspirin 81 MG EC tablet, Take 81 mg by mouth daily., Disp: , Rfl: 3 .  atenolol (TENORMIN) 50 MG tablet, Take 50 mg by mouth daily., Disp: , Rfl: 3 .  cetirizine (ZYRTEC) 10 MG tablet, Take 10 mg by mouth daily., Disp: , Rfl:  .  Cholecalciferol (VITAMIN D) 2000 UNITS CAPS, Take 1 capsule by mouth 2 (two) times daily., Disp: , Rfl:  .  co-enzyme Q-10 30 MG capsule, Take 30 mg by mouth daily. , Disp: , Rfl:  .  ferrous sulfate 325 (65 FE) MG tablet, Take 325 mg by mouth daily with breakfast., Disp: , Rfl:  .  fexofenadine (ALLEGRA) 180 MG tablet, Take 180 mg by mouth daily., Disp: , Rfl:  .  fluticasone (FLONASE) 50 MCG/ACT nasal spray, Place 2 sprays into both nostrils daily., Disp: , Rfl: 12 .  furosemide (LASIX) 40 MG tablet, Take 40 mg by mouth daily., Disp: ,  Rfl:  .  glucosamine-chondroitin 500-400 MG tablet, Take 2 tablets by mouth 2 (two) times daily., Disp: , Rfl:  .  KLOR-CON 10 10 MEQ tablet, Take 10 mEq by mouth daily., Disp: , Rfl: 3 .  lisinopril-hydrochlorothiazide (PRINZIDE,ZESTORETIC) 20-12.5 MG per tablet, Take 2 tablets by mouth daily., Disp: , Rfl: 3 .  Lorcaserin HCl 10 MG TABS, Take 10 mg by mouth., Disp: , Rfl:  .  magnesium oxide (MAG-OX) 400 MG tablet, Take 400 mg by mouth 2 (two) times daily., Disp: , Rfl:  .  Misc Natural Products (LEG VEIN & CIRCULATION PO), Take 1 tablet by mouth 2 (two) times daily., Disp: , Rfl:  .  Multiple Vitamins-Minerals (MULTIVITAMIN WITH MINERALS) tablet, Take 2-3 tablets by mouth 2 (two) times daily. 3 tab in the morning and 2 in the afternoon, Disp: , Rfl:  .  Omega-3 Fatty Acids (OMEGA III EPA+DHA PO), Take 1 tablet by mouth 2 (two) times daily., Disp: , Rfl:  .  omeprazole (PRILOSEC) 40 MG capsule, Take 40 mg by mouth daily., Disp: , Rfl:  .  potassium chloride (K-DUR,KLOR-CON) 10 MEQ tablet, Take 10 mEq by mouth daily., Disp: , Rfl:  .  SUPREP BOWEL PREP SOLN, Take 1 each by mouth once., Disp: , Rfl: 0 .  traMADol (ULTRAM) 50 MG tablet, Take by mouth every 6 (six) hours as needed., Disp: , Rfl:  No medications prior to admission.     Allergies  Allergen Reactions  . Maxzide [Hydrochlorothiazide W-Triamterene] Other (See Comments)    unk  . Donnatal [Pb-Hyoscy-Atropine-Scopolamine] Itching and Rash     Past Medical History:  Diagnosis Date  . Chronic anemia   . DJD (degenerative joint disease)   . Edema of both legs   . External hemorrhoid   . Gastric polyps   . Gastric ulcer   . Gastritis   . GERD (gastroesophageal reflux disease)   . Hyperlipidemia, mixed   . Hypertension   . Osteoarthritis of both knees   . Ovarian cancer ([place])   . Rhinitis, allergic   . RLS (restless legs syndrome)   . Sciatica   . Sleep apnea   . Uterine cancer ([place])     Review of systems:   Otherwise negative.    Physical Exam  Gen: Alert, oriented. Appears stated age.  HEENT: [place]/AT. PERRLA. Lungs: CTA, no wheezes. CV: RR nl S1, S2. Abd: soft, benign, no masses. BS+ Ext: No edema. Pulses 2+    Planned procedures: Proceed with colonoscopy. The patient understands the nature of the planned procedure, indications, risks, alternatives and potential complications including but not limited to bleeding, infection, perforation, damage to internal organs and possible oversedation/side effects from anesthesia. The patient agrees and gives consent to proceed.  Please refer to procedure notes for findings, recommendations and patient disposition/instructions.     Tytus Strahle K. Alice Reichert, M.D. Gastroenterology 12/21/2020  12:44 PM

## 2020-12-22 ENCOUNTER — Encounter: Payer: Self-pay | Admitting: Internal Medicine

## 2020-12-22 NOTE — Anesthesia Postprocedure Evaluation (Signed)
Anesthesia Post Note  Patient: Sandra Hubbard  Procedure(s) Performed: COLONOSCOPY WITH PROPOFOL (N/A )  Patient location during evaluation: Endoscopy Anesthesia Type: General Level of consciousness: awake and alert Pain management: pain level controlled Vital Signs Assessment: post-procedure vital signs reviewed and stable Respiratory status: spontaneous breathing, nonlabored ventilation, respiratory function stable and patient connected to nasal cannula oxygen Cardiovascular status: blood pressure returned to baseline and stable Postop Assessment: no apparent nausea or vomiting Anesthetic complications: no   No complications documented.   Last Vitals:  Vitals:   12/21/20 1529 12/21/20 1539  BP: (!) 92/55 (!) 143/57  Pulse: (!) 53 (!) 51  Resp: 15 11  Temp:    SpO2: 100% 100%    Last Pain:  Vitals:   12/21/20 1539  TempSrc:   PainSc: 0-No pain                 Precious Haws [name]

## 2020-12-23 LAB — SURGICAL PATHOLOGY

## 2023-08-17 ENCOUNTER — Emergency Department
Admission: EM | Admit: 2023-08-17 | Discharge: 2023-08-18 | Discharge status: Against Medical Advice | Payer: Medicare Other | Attending: Emergency Medicine | Admitting: Emergency Medicine

## 2023-08-17 ENCOUNTER — Other Ambulatory Visit: Payer: Self-pay

## 2023-08-17 ENCOUNTER — Emergency Department: Payer: Medicare Other

## 2023-08-17 DIAGNOSIS — Z5321 Procedure and treatment not carried out due to patient leaving prior to being seen by health care provider: Secondary | ICD-10-CM | POA: Insufficient documentation

## 2023-08-17 DIAGNOSIS — R0602 Shortness of breath: Secondary | ICD-10-CM | POA: Diagnosis present

## 2023-08-17 DIAGNOSIS — M7989 Other specified soft tissue disorders: Secondary | ICD-10-CM | POA: Diagnosis not present

## 2023-08-17 LAB — CBC
HCT: 37.6 % (ref 36.0–46.0)
Hemoglobin: 12.4 g/dL (ref 12.0–15.0)
MCH: 29.4 pg (ref 26.0–34.0)
MCHC: 33 g/dL (ref 30.0–36.0)
MCV: 89.1 fL (ref 80.0–100.0)
Platelets: 491 10*3/uL — ABNORMAL HIGH (ref 150–400)
RBC: 4.22 MIL/uL (ref 3.87–5.11)
RDW: 13.9 % (ref 11.5–15.5)
WBC: 6.6 10*3/uL (ref 4.0–10.5)
nRBC: 0 % (ref 0.0–0.2)

## 2023-08-17 LAB — BASIC METABOLIC PANEL
Anion gap: 13 (ref 5–15)
BUN: 16 mg/dL (ref 8–23)
CO2: 20 mmol/L — ABNORMAL LOW (ref 22–32)
Calcium: 9.3 mg/dL (ref 8.9–10.3)
Chloride: 103 mmol/L (ref 98–111)
Creatinine, Ser: 0.5 mg/dL (ref 0.44–1.00)
GFR, Estimated: >60 mL/min (ref >60)
Glucose, Bld: 141 mg/dL — ABNORMAL HIGH (ref 70–99)
Potassium: 3.6 mmol/L (ref 3.5–5.1)
Sodium: 136 mmol/L (ref 135–145)

## 2023-08-17 LAB — TROPONIN I (HIGH SENSITIVITY): Troponin I (High Sensitivity): 10 ng/L (ref <18)

## 2023-08-17 NOTE — ED Triage Notes (Addendum)
Pt reports shortness of breath that began a few days ago, pt reports dyspnea with rest and exertion. Pt has swelling to legs and feet, pt reports taking lasix. Denies missing dose of medications. Pt denies cough or congestion, denies chest pain

## 2023-08-17 NOTE — ED Notes (Signed)
Pt refusing to have her blood pressure taken

## 2023-08-18 LAB — BRAIN NATRIURETIC PEPTIDE: B Natriuretic Peptide: 276.2 pg/mL — ABNORMAL HIGH (ref 0.0–100.0)

## 2023-09-03 ENCOUNTER — Other Ambulatory Visit: Payer: Self-pay | Admitting: Physician Assistant

## 2023-09-03 DIAGNOSIS — R413 Other amnesia: Secondary | ICD-10-CM

## 2023-09-09 ENCOUNTER — Ambulatory Visit
Admission: RE | Admit: 2023-09-09 | Discharge: 2023-09-09 | Disposition: A | Discharge status: Home / Self Care | Payer: Medicare Other | Source: Ambulatory Visit | Attending: Physician Assistant | Admitting: Physician Assistant

## 2023-09-09 DIAGNOSIS — R413 Other amnesia: Secondary | ICD-10-CM | POA: Insufficient documentation

## 2023-09-10 ENCOUNTER — Other Ambulatory Visit: Payer: Self-pay

## 2023-09-10 ENCOUNTER — Encounter: Payer: Self-pay | Admitting: Intensive Care

## 2023-09-10 ENCOUNTER — Observation Stay
Admission: EM | Admit: 2023-09-10 | Discharge: 2023-09-11 | Disposition: A | Discharge status: Home / Self Care | Payer: Medicare Other | Attending: Osteopathic Medicine | Admitting: Osteopathic Medicine

## 2023-09-10 DIAGNOSIS — R2689 Other abnormalities of gait and mobility: Secondary | ICD-10-CM | POA: Diagnosis not present

## 2023-09-10 DIAGNOSIS — I639 Cerebral infarction, unspecified: Secondary | ICD-10-CM | POA: Diagnosis present

## 2023-09-10 DIAGNOSIS — R41841 Cognitive communication deficit: Secondary | ICD-10-CM | POA: Diagnosis present

## 2023-09-10 DIAGNOSIS — G4730 Sleep apnea, unspecified: Secondary | ICD-10-CM | POA: Insufficient documentation

## 2023-09-10 DIAGNOSIS — I10 Essential (primary) hypertension: Secondary | ICD-10-CM | POA: Diagnosis not present

## 2023-09-10 DIAGNOSIS — G2581 Restless legs syndrome: Secondary | ICD-10-CM | POA: Insufficient documentation

## 2023-09-10 DIAGNOSIS — G4733 Obstructive sleep apnea (adult) (pediatric): Secondary | ICD-10-CM | POA: Insufficient documentation

## 2023-09-10 DIAGNOSIS — Z79899 Other long term (current) drug therapy: Secondary | ICD-10-CM | POA: Insufficient documentation

## 2023-09-10 DIAGNOSIS — M51369 Other intervertebral disc degeneration, lumbar region without mention of lumbar back pain or lower extremity pain: Secondary | ICD-10-CM | POA: Diagnosis not present

## 2023-09-10 DIAGNOSIS — G3184 Mild cognitive impairment, so stated: Secondary | ICD-10-CM | POA: Insufficient documentation

## 2023-09-10 DIAGNOSIS — Z6841 Body Mass Index (BMI) 40.0 and over, adult: Secondary | ICD-10-CM | POA: Insufficient documentation

## 2023-09-10 DIAGNOSIS — E66813 Obesity, class 3: Secondary | ICD-10-CM | POA: Diagnosis not present

## 2023-09-10 DIAGNOSIS — I872 Venous insufficiency (chronic) (peripheral): Secondary | ICD-10-CM | POA: Diagnosis present

## 2023-09-10 DIAGNOSIS — R262 Difficulty in walking, not elsewhere classified: Secondary | ICD-10-CM | POA: Insufficient documentation

## 2023-09-10 DIAGNOSIS — K219 Gastro-esophageal reflux disease without esophagitis: Secondary | ICD-10-CM | POA: Diagnosis not present

## 2023-09-10 DIAGNOSIS — I110 Hypertensive heart disease with heart failure: Secondary | ICD-10-CM | POA: Insufficient documentation

## 2023-09-10 LAB — CBC WITH DIFFERENTIAL/PLATELET
Abs Immature Granulocytes: 0.02 10*3/uL (ref 0.00–0.07)
Basophils Absolute: 0 10*3/uL (ref 0.0–0.1)
Basophils Relative: 1 %
Eosinophils Absolute: 0.1 10*3/uL (ref 0.0–0.5)
Eosinophils Relative: 2 %
HCT: 39 % (ref 36.0–46.0)
Hemoglobin: 12.8 g/dL (ref 12.0–15.0)
Immature Granulocytes: 0 %
Lymphocytes Relative: 21 %
Lymphs Abs: 1.1 10*3/uL (ref 0.7–4.0)
MCH: 29 pg (ref 26.0–34.0)
MCHC: 32.8 g/dL (ref 30.0–36.0)
MCV: 88.4 fL (ref 80.0–100.0)
Monocytes Absolute: 0.4 10*3/uL (ref 0.1–1.0)
Monocytes Relative: 7 %
Neutro Abs: 3.9 10*3/uL (ref 1.7–7.7)
Neutrophils Relative %: 69 %
Platelets: 439 10*3/uL — ABNORMAL HIGH (ref 150–400)
RBC: 4.41 MIL/uL (ref 3.87–5.11)
RDW: 13.6 % (ref 11.5–15.5)
WBC: 5.5 10*3/uL (ref 4.0–10.5)
nRBC: 0 % (ref 0.0–0.2)

## 2023-09-10 LAB — PROTIME-INR
INR: 1 (ref 0.8–1.2)
Prothrombin Time: 13.4 s (ref 11.4–15.2)

## 2023-09-10 MED ORDER — ACETAMINOPHEN 650 MG RE SUPP: 650.0000 mg | Freq: Four times a day (QID) | RECTAL | Status: DC | PRN

## 2023-09-10 MED ORDER — ENOXAPARIN SODIUM 60 MG/0.6ML IJ SOSY
55.0000 mg | PREFILLED_SYRINGE | INTRAMUSCULAR | Status: DC
  Administered 2023-09-11: 55 mg via SUBCUTANEOUS
  Filled 2023-09-10: qty 0.6

## 2023-09-10 MED ORDER — POTASSIUM CHLORIDE CRYS ER 10 MEQ PO TBCR
10.0000 meq | EXTENDED_RELEASE_TABLET | Freq: Every day | ORAL | Status: DC
  Administered 2023-09-11: 10 meq via ORAL
  Filled 2023-09-10 (×2): qty 1

## 2023-09-10 MED ORDER — PANTOPRAZOLE SODIUM 40 MG PO TBEC
40.0000 mg | DELAYED_RELEASE_TABLET | Freq: Every day | ORAL | Status: DC
  Administered 2023-09-11: 40 mg via ORAL
  Filled 2023-09-10: qty 1

## 2023-09-10 MED ORDER — LOSARTAN POTASSIUM 50 MG PO TABS
100.0000 mg | ORAL_TABLET | Freq: Every day | ORAL | Status: DC
  Administered 2023-09-11: 100 mg via ORAL
  Filled 2023-09-10: qty 2

## 2023-09-10 MED ORDER — ATENOLOL 25 MG PO TABS
50.0000 mg | ORAL_TABLET | Freq: Every day | ORAL | Status: DC
  Administered 2023-09-11: 50 mg via ORAL
  Filled 2023-09-10: qty 2

## 2023-09-10 MED ORDER — STROKE: EARLY STAGES OF RECOVERY BOOK: Freq: Once | Status: AC

## 2023-09-10 MED ORDER — ACETAMINOPHEN 325 MG RE SUPP: 650.0000 mg | RECTAL | Status: DC | PRN

## 2023-09-10 MED ORDER — ACETAMINOPHEN 325 MG PO TABS: 650.0000 mg | ORAL_TABLET | Freq: Four times a day (QID) | ORAL | Status: DC | PRN

## 2023-09-10 MED ORDER — TORSEMIDE 20 MG PO TABS
20.0000 mg | ORAL_TABLET | Freq: Every day | ORAL | Status: DC
  Administered 2023-09-11: 20 mg via ORAL
  Filled 2023-09-10: qty 1

## 2023-09-10 MED ORDER — ASPIRIN 81 MG PO TBEC
81.0000 mg | DELAYED_RELEASE_TABLET | Freq: Every day | ORAL | Status: DC
  Administered 2023-09-11 (×2): 81 mg via ORAL
  Filled 2023-09-10 (×2): qty 1

## 2023-09-10 MED ORDER — ACETAMINOPHEN 325 MG PO TABS: 650.0000 mg | ORAL_TABLET | ORAL | Status: DC | PRN

## 2023-09-10 MED ORDER — ENOXAPARIN SODIUM 40 MG/0.4ML IJ SOSY: 40.0000 mg | PREFILLED_SYRINGE | INTRAMUSCULAR | Status: DC

## 2023-09-10 MED ORDER — CLOPIDOGREL BISULFATE 75 MG PO TABS
75.0000 mg | ORAL_TABLET | Freq: Every day | ORAL | Status: DC
  Administered 2023-09-11 (×2): 75 mg via ORAL
  Filled 2023-09-10 (×2): qty 1

## 2023-09-10 MED ORDER — ACETAMINOPHEN 160 MG/5ML PO SOLN: 650.0000 mg | ORAL | Status: DC | PRN

## 2023-09-10 MED ORDER — HYDROCODONE-ACETAMINOPHEN 5-325 MG PO TABS
1.0000 | ORAL_TABLET | ORAL | Status: DC | PRN
Start: 2023-09-10 — End: 2023-09-11

## 2023-09-10 NOTE — ED Provider Triage Note (Signed)
Emergency Medicine Provider Triage Evaluation Note  Sandra Hubbard , a 83 y.o. female  was evaluated in triage.  Pt complains of worsening memory x years. 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 No new paresthesias, weakness, gait changes, speech changes. Patient reports she feels her normal self. Son reports that she is at her baseline.  Review of Systems  Positive: Memory changes over years Negative: paresthesias, weakness, gait changes, speech changes  Physical Exam  There were no vitals taken for this visit. Gen:   Awake, no distress   Resp:  Normal effort  MSK:   Moves extremities without difficulty  Other:    Medical Decision Making  Medically screening exam initiated at 5:30 PM.  Appropriate orders placed.  MIRAH SITTLER was informed that the remainder of the evaluation will be completed by another provider, this initial triage assessment does not replace that evaluation, and the importance of remaining in the ED until their evaluation is complete.     Jackelyn Hoehn, PA-C 09/10/23 1735

## 2023-09-10 NOTE — Assessment & Plan Note (Signed)
BP control as patient more than 48 hrs from symptoms  Statins for LDL goal less than 70 ASA 81mg  daily, Plavix 75mg  daily x 3 weeks then monotherapy thereafter Telemetry, echo Physical therapy/Occupational therapy/Speech therapy if failed dysphagia screen Neurology consult to follow

## 2023-09-10 NOTE — Assessment & Plan Note (Addendum)
Continue losartan and atenolol.  Will hold HCTZ as patient is on Lasix Goal BP under 130/80

## 2023-09-10 NOTE — Assessment & Plan Note (Signed)
Complicating factor to overall prognosis and care 

## 2023-09-10 NOTE — ED Provider Notes (Signed)
[facility] Provider Note    Event Date/Time   First MD Initiated Contact with Patient 09/10/23 2005     (approximate)   History   Cerebrovascular Accident   HPI Sandra Hubbard is a 83 y.o. female with history of HTN, CHF, GERD, dementia presenting today for stroke.  Patient reportedly had an outpatient MRI yesterday which showed concerns for acute versus subacute stroke.  Patient does have history of dementia with some memory loss and is not entirely sure why she is here other than that she had an abnormal MRI.  She reports recently having 1 episode of difficulty word finding and slurred speech which was confirmed by her son.  No repeat episodes since then.  Otherwise denies vision changes, numbness, weakness, difficulty ambulating.  Reviewed outpatient MRI from yesterday with evidence of punctate acute/early subacute infarct within the right insula.  Spoke with son. She had one episode with difficulty getting her words out. Words were slurred. Has not had a repeat of that episode since then. No other neurological deficits.     Physical Exam   Triage Vital Signs: ED Triage Vitals  Encounter Vitals Group     BP 09/10/23 1730 (!) 163/68     Systolic BP Percentile --      Diastolic BP Percentile --      Pulse Rate 09/10/23 1730 74     Resp 09/10/23 1730 20     Temp 09/10/23 1730 97.8 F (36.6 C)     Temp Source 09/10/23 1730 Oral     SpO2 09/10/23 1730 100 %     Weight 09/10/23 1731 243 lb (110.2 kg)     Height 09/10/23 1731 5' (1.524 m)     Head Circumference --      Peak Flow --      Pain Score 09/10/23 1730 0     Pain Loc --      Pain Education --      Exclude from Growth Chart --     Most recent vital signs: Vitals:   09/10/23 1730  BP: (!) 163/68  Pulse: 74  Resp: 20  Temp: 97.8 F (36.6 C)  SpO2: 100%   I have reviewed the vital signs. General:  Awake, alert, no acute distress. Head:  Normocephalic, Atraumatic. EENT:  PERRL,  EOMI, Oral mucosa pink and moist, Neck is supple. Cardiovascular: Regular rate, 2+ distal pulses. Respiratory:  Normal respiratory effort, symmetrical expansion, no distress.   Extremities:  Moving all four extremities through full ROM without pain.   Neuro:  Alert and oriented.  Interacting appropriately. Alert and oriented with cogent speech; 5/5 motor strength all 4 extremities with intact peripheral nerve distributions; gross sensory intact to challenge in all 4 extremities and peripheral nerve distributions; no clonus; cerebellar examination normal including Romberg, finger-to-nose, heel-to-shin, and without trunkal ataxia, dysdiadochokinesis or dysmetria; cranial nerves 2-12 intact to gross challenge bilaterally.  Skin:  Warm, dry, no rash.   Psych: Appropriate affect.    ED Results / Procedures / Treatments   Labs (all labs ordered are listed, but only abnormal results are displayed) Labs Reviewed  CBC WITH DIFFERENTIAL/PLATELET - Abnormal; Notable for the following components:      Result Value   Platelets 439 (*)    All other components within normal limits  PROTIME-INR  BASIC METABOLIC PANEL     EKG My EKG interpretation: Rate of 72, normal sinus rhythm, normal axis, normal intervals.  No acute ST elevations or  depressions   RADIOLOGY Independently interpreted outpatient MRI with evidence of acute versus subacute stroke   PROCEDURES:  Critical Care performed: Yes, see critical care procedure note(s)  Procedures   MEDICATIONS ORDERED IN ED: Medications - No data to display   IMPRESSION / MDM / ASSESSMENT AND PLAN / ED COURSE  I reviewed the triage vital signs and the nursing notes.                              Differential diagnosis includes, but is not limited to, CVA  Patient's presentation is most consistent with acute presentation with potential threat to life or bodily function.  Patient is an 83 year old female presenting today for outpatient MRI  showing evidence of acute stroke.  Only reported recent neurological change was 1 episode of slurred speech and difficulty word finding that happened approximately 1 week ago.  No repeat episodes.  Otherwise no acute numbness, weakness, vision changes, difficulty walking from her baseline per patient and her son.  Neurological exam unremarkable here today.  Laboratory workup otherwise reassuring and EKG unremarkable.  NIHSS of 0.  No clear timeline of when symptoms happen so not a candidate for TNK and no large vessel occlusion to necessitate thrombectomy.  Will admit to hospitalist for ongoing stroke workup given first-time episode and not currently on standard therapy.  The patient is on the cardiac monitor to evaluate for evidence of arrhythmia and/or significant heart rate changes.     FINAL CLINICAL IMPRESSION(S) / ED DIAGNOSES   Final diagnoses:  Cerebrovascular accident (CVA), unspecified mechanism (HCC)     Rx / DC Orders   ED Discharge Orders     None        Note:  This document was prepared using Dragon voice recognition software and may include unintentional dictation errors.   Janith Lima, MD 09/10/23 2032

## 2023-09-10 NOTE — Assessment & Plan Note (Signed)
Continue Lasix 

## 2023-09-10 NOTE — ED Triage Notes (Signed)
Patient had MRI yesterday at request of son due to patients age and worsening memory. MRI showed infarct and they sent patient to ER  Change in mental status has been ongoing for years per son.  Baseline uses walker/cane. Lives at home with grandson

## 2023-09-10 NOTE — Assessment & Plan Note (Addendum)
Followed by neurology, last seen earlier this month, Mini-Mental 27/30 Delirium precautions

## 2023-09-10 NOTE — H&P (Signed)
History and Physical    Patient: Sandra Hubbard:[id number] DOB: 1941/03/26 DOA: 09/10/2023 DOS: the patient was seen and examined on 09/10/2023 PCP: Lauro Regulus, MD  Patient coming from: Home  Chief Complaint:  Chief Complaint  Patient presents with   Cerebrovascular Accident    HPI: Sandra Hubbard is a 83 y.o. female with medical history significant for Mild cognitive deficit, OSA, morbid obesity, RLS, arthritis dependent on cane/walker for ambulation, GERD and HTN, sent in  for stroke workup.  She had an MRI on 1/28 that showed acute versus subacute stroke.  MRI was ordered due to an episode of difficulty word finding with slurred speech that occurred about a week prior.  She has otherwise been at her baseline. In the ED, BP slightly elevated to 162/68 with otherwise normal vitals Labs available, CBC and INR were normal.  BMP pending due to technical problems with analyzer. EKG showing NSR at 72 no acute ST-T wave changes. MRI from 09/09/2023 showed the following: IMPRESSION: 1. Punctate acute/early subacute infarct within the right insula. 2. Moderate chronic small vessel ischemic changes within the cerebral white matter. 3. Mild generalized cerebral atrophy.  Hospitalist consulted for admission.       Past Medical History:  Diagnosis Date   Chronic anemia    DJD (degenerative joint disease)    Edema of both legs    External hemorrhoid    Gastric polyps    Gastric ulcer    Gastritis    GERD (gastroesophageal reflux disease)    Hyperlipidemia, mixed    Hypertension    Osteoarthritis of both knees    Ovarian cancer (HCC)    Rhinitis, allergic    RLS (restless legs syndrome)    Sciatica    Sleep apnea    Uterine cancer [facility])    Past Surgical History:  Procedure Laterality Date   ABDOMINAL HYSTERECTOMY     BREAST BIOPSY Left 04/02/2017   2 area STROMAL FIBROSIS. NEGATIVE FOR ATYPIA   BREAST BIOPSY Right 09/19/2018   [place] bx heart marker- papilloma    CHOLECYSTECTOMY     COLONOSCOPY N/A 12/21/2014   Procedure: COLONOSCOPY;  Surgeon: Elnita Maxwell, MD;  Location: [facility] ENDOSCOPY;  Service: Endoscopy;  Laterality: N/A;   COLONOSCOPY WITH PROPOFOL N/A 12/21/2020   Procedure: COLONOSCOPY WITH PROPOFOL;  Surgeon: Toledo, Boykin Nearing, MD;  Location: [facility] ENDOSCOPY;  Service: Gastroenterology;  Laterality: N/A;   DILATION AND CURETTAGE, DIAGNOSTIC / THERAPEUTIC     ESOPHAGOGASTRODUODENOSCOPY N/A 12/21/2014   Procedure: ESOPHAGOGASTRODUODENOSCOPY (EGD);  Surgeon: Elnita Maxwell, MD;  Location: [facility] ENDOSCOPY;  Service: Endoscopy;  Laterality: N/A;   ESOPHAGOGASTRODUODENOSCOPY N/A 01/27/2015   Procedure: ESOPHAGOGASTRODUODENOSCOPY (EGD);  Surgeon: Elnita Maxwell, MD;  Location: [facility] ENDOSCOPY;  Service: Endoscopy;  Laterality: N/A;   ESOPHAGOGASTRODUODENOSCOPY (EGD) WITH PROPOFOL N/A 06/07/2017   Procedure: ESOPHAGOGASTRODUODENOSCOPY (EGD) WITH PROPOFOL;  Surgeon: Toledo, Boykin Nearing, MD;  Location: [facility] ENDOSCOPY;  Service: Gastroenterology;  Laterality: N/A;   EYE SURGERY     cataract    HIATAL HERNIA REPAIR     TONSILLECTOMY     Social History:  reports that she has never smoked. She has never used smokeless tobacco. She reports that she does not drink alcohol and does not use drugs.  Allergies  Allergen Reactions   Maxzide [Hydrochlorothiazide W-Triamterene] Other (See Comments)    unk   Donnatal [Pb-Hyoscy-Atropine-Scopolamine] Itching and Rash    Family History  Problem Relation Age of Onset   Breast cancer Neg Hx  Prior to Admission medications   Medication Sig Start Date End Date Taking? Authorizing Provider  acetaminophen (TYLENOL) 650 MG CR tablet Take 1,300 mg by mouth every 8 (eight) hours as needed for pain.    [provider]  aspirin 81 MG EC tablet Take 81 mg by mouth daily. Patient not taking: Reported on 12/21/2020 11/05/14   [provider]  atenolol (TENORMIN) 50 MG tablet Take 50 mg by mouth  daily. 10/14/14   [provider]  calcium carbonate (OS-CAL) 1250 (500 Ca) MG chewable tablet Chew 1 tablet by mouth daily.    [provider]  cetirizine (ZYRTEC) 10 MG tablet Take 10 mg by mouth daily.    [provider]  Cholecalciferol (VITAMIN D) 2000 UNITS CAPS Take 1 capsule by mouth 2 (two) times daily.    [provider]  co-enzyme Q-10 30 MG capsule Take 30 mg by mouth daily.     [provider]  diclofenac Sodium (VOLTAREN) 1 % GEL Apply topically 4 (four) times daily.    [provider]  ferrous sulfate 325 (65 FE) MG tablet Take 325 mg by mouth daily with breakfast.    [provider]  fexofenadine (ALLEGRA) 180 MG tablet Take 180 mg by mouth daily.    [provider]  fluticasone (FLONASE) 50 MCG/ACT nasal spray Place 2 sprays into both nostrils daily. 12/02/14   [provider]  furosemide (LASIX) 40 MG tablet Take 40 mg by mouth daily.    [provider]  glucosamine-chondroitin 500-400 MG tablet Take 2 tablets by mouth 2 (two) times daily.    [provider]  hydrochlorothiazide (HYDRODIURIL) 12.5 MG tablet Take 12.5 mg by mouth daily.    [provider]  KLOR-CON 10 10 MEQ tablet Take 10 mEq by mouth daily. 10/14/14   [provider]  lisinopril-hydrochlorothiazide (PRINZIDE,ZESTORETIC) 20-12.5 MG per tablet Take 2 tablets by mouth daily. 10/14/14   [provider]  Lorcaserin HCl 10 MG TABS Take 10 mg by mouth.    [provider]  losartan (COZAAR) 100 MG tablet Take 100 mg by mouth daily.    [provider]  magnesium oxide (MAG-OX) 400 MG tablet Take 400 mg by mouth 2 (two) times daily.    [provider]  Misc Natural Products (LEG VEIN & CIRCULATION PO) Take 1 tablet by mouth 2 (two) times daily.    [provider]  Multiple Vitamins-Minerals (MULTIVITAMIN WITH MINERALS) tablet Take 2-3 tablets by mouth 2 (two) times  daily. 3 tab in the morning and 2 in the afternoon    [provider]  olopatadine (PATANOL) 0.1 % ophthalmic solution 1 drop 2 (two) times daily.    [provider]  Omega-3 Fatty Acids (OMEGA III EPA+DHA PO) Take 1 tablet by mouth 2 (two) times daily.    [provider]  omeprazole (PRILOSEC) 40 MG capsule Take 40 mg by mouth daily.    [provider]  potassium chloride (K-DUR,KLOR-CON) 10 MEQ tablet Take 10 mEq by mouth daily.    [provider]  SUPREP BOWEL PREP SOLN Take 1 each by mouth once. 12/02/14   [provider]  traMADol (ULTRAM) 50 MG tablet Take by mouth every 6 (six) hours as needed.    [provider]    Physical Exam: Vitals:   09/10/23 1730 09/10/23 1731 09/10/23 2030  BP: (!) 163/68  (!) 166/77  Pulse: 74  73  Resp: 20  Temp: 97.8 F (36.6 C)    TempSrc: Oral    SpO2: 100%  100%  Weight:  110.2 kg   Height:  5' (1.524 m)    Physical Exam  Labs on Admission: I have personally reviewed following labs and imaging studies  CBC: Recent Labs  Lab 09/10/23 1733  WBC 5.5  NEUTROABS 3.9  HGB 12.8  HCT 39.0  MCV 88.4  PLT 439*   Basic Metabolic Panel: No results for input(s): "NA", "K", "CL", "CO2", "GLUCOSE", "BUN", "CREATININE", "CALCIUM", "MG", "PHOS" in the last 168 hours. GFR: CrCl cannot be calculated (Patient's most recent lab result is older than the maximum 21 days allowed.). Liver Function Tests: No results for input(s): "AST", "ALT", "ALKPHOS", "BILITOT", "PROT", "ALBUMIN" in the last 168 hours. No results for input(s): "LIPASE", "AMYLASE" in the last 168 hours. No results for input(s): "AMMONIA" in the last 168 hours. Coagulation Profile: Recent Labs  Lab 09/10/23 1733  INR 1.0   Cardiac Enzymes: No results for input(s): "CKTOTAL", "CKMB", "CKMBINDEX", "TROPONINI" in the last 168 hours. BNP (last 3 results) No results for input(s): "PROBNP" in the last 8760 hours. HbA1C: No  results for input(s): "HGBA1C" in the last 72 hours. CBG: No results for input(s): "GLUCAP" in the last 168 hours. Lipid Profile: No results for input(s): "CHOL", "HDL", "LDLCALC", "TRIG", "CHOLHDL", "LDLDIRECT" in the last 72 hours. Thyroid Function Tests: No results for input(s): "TSH", "T4TOTAL", "FREET4", "T3FREE", "THYROIDAB" in the last 72 hours. Anemia Panel: No results for input(s): "VITAMINB12", "FOLATE", "FERRITIN", "TIBC", "IRON", "RETICCTPCT" in the last 72 hours. Urine analysis: No results found for: "COLORURINE", "APPEARANCEUR", "LABSPEC", "PHURINE", "GLUCOSEU", "HGBUR", "BILIRUBINUR", "KETONESUR", "PROTEINUR", "UROBILINOGEN", "NITRITE", "LEUKOCYTESUR"  Radiological Exams on Admission: MR BRAIN WO CONTRAST Result Date: 09/10/2023 CLINICAL DATA:  Provided history: Memory loss or impairment. EXAM: MRI HEAD WITHOUT CONTRAST TECHNIQUE: Multiplanar, multiecho pulse sequences of the brain and surrounding structures were obtained without intravenous contrast. COMPARISON:  None. FINDINGS: Brain: Mild generalized cerebral volume loss. Punctate acute/early subacute infarct within the right insula (series 5, image 27). Multifocal T2 FLAIR hyperintense signal abnormality within the cerebral white matter, nonspecific but compatible with moderate for age chronic small vessel ischemic disease. No evidence of an intracranial mass. No chronic intracranial blood products. No extra-axial fluid collection. No midline shift. Vascular: Maintained flow voids within the proximal large arterial vessels. Skull and upper cervical spine: No focal worrisome marrow lesion. Incompletely assessed cervical spondylosis. Sinuses/Orbits: No mass or acute finding within the imaged orbits. Prior ocular lens replacement. No significant paranasal sinus disease. These results will be called to the ordering clinician or representative by the Radiologist Assistant, and communication documented in the PACS or [employer].  IMPRESSION: 1. Punctate acute/early subacute infarct within the right insula. 2. Moderate chronic small vessel ischemic changes within the cerebral white matter. 3. Mild generalized cerebral atrophy. Electronically Signed   By: Jackey Loge D.O.   On: 09/10/2023 15:13     Data Reviewed: Relevant notes from primary care and specialist visits, past discharge summaries as available in EHR, including Care Everywhere. Prior diagnostic testing as pertinent to current admission diagnoses Updated medications and problem lists for reconciliation ED course, including vitals, labs, imaging, treatment and response to treatment Triage notes, nursing and pharmacy notes and ED provider's notes Notable results as noted in HPI   Assessment and Plan: * Acute CVA (cerebrovascular accident) (HCC) BP control as patient more than 48 hrs from symptoms  Statins for LDL goal less than 70 ASA 81mg   daily, Plavix 75mg  daily x 3 weeks then monotherapy thereafter Telemetry, echo Physical therapy/Occupational therapy/Speech therapy if failed dysphagia screen Neurology consult to follow   Mild cognitive impairment Followed by neurology, last seen earlier this month, Mini-Mental 27/30 Delirium precautions  Sleep apnea Continue CPAP  GERD (gastroesophageal reflux disease) History of peptic ulcer disease Continue omeprazole  DDD (degenerative disc disease), lumbar Osteoarthritis with ambulatory dysfunction Ambulates with cane/walker at baseline  Chronic venous insufficiency Continue Lasix  Obesity, Class III, BMI 40-49.9 (morbid obesity) (HCC) Complicating factor to overall prognosis and care  Essential hypertension Continue losartan and atenolol.  Will hold HCTZ as patient is on Lasix Goal BP under 130/80     DVT prophylaxis: Lovenox  Consults: neurology  Advance Care Planning: full code  Family Communication: none***  Disposition Plan: Back to previous home environment  Severity of  Illness: The appropriate patient status for this patient is OBSERVATION. Observation status is judged to be reasonable and necessary in order to provide the required intensity of service to ensure the patient's safety. The patient's presenting symptoms, physical exam findings, and initial radiographic and laboratory data in the context of their medical condition is felt to place them at decreased risk for further clinical deterioration. Furthermore, it is anticipated that the patient will be medically stable for discharge from the hospital within 2 midnights of admission.   Author: Andris Baumann, MD 09/10/2023 9:53 PM  For on call review [email or web address].

## 2023-09-10 NOTE — Assessment & Plan Note (Signed)
Continue CPAP.  

## 2023-09-10 NOTE — Assessment & Plan Note (Signed)
Osteoarthritis with ambulatory dysfunction Ambulates with cane/walker at baseline

## 2023-09-10 NOTE — Assessment & Plan Note (Addendum)
History of peptic ulcer disease Continue omeprazole

## 2023-09-11 ENCOUNTER — Observation Stay (HOSPITAL_BASED_OUTPATIENT_CLINIC_OR_DEPARTMENT_OTHER)
Admit: 2023-09-11 | Discharge: 2023-09-11 | Disposition: A | Discharge status: Home / Self Care | Payer: Medicare Other | Attending: Internal Medicine

## 2023-09-11 ENCOUNTER — Inpatient Hospital Stay (HOSPITAL_COMMUNITY)
Admit: 2023-09-11 | Discharge: 2023-09-11 | Disposition: A | Discharge status: Home / Self Care | Payer: TRICARE For Life (TFL) | Attending: Medical | Admitting: Medical

## 2023-09-11 ENCOUNTER — Observation Stay: Payer: Medicare Other

## 2023-09-11 DIAGNOSIS — I6389 Other cerebral infarction: Secondary | ICD-10-CM | POA: Diagnosis not present

## 2023-09-11 DIAGNOSIS — I639 Cerebral infarction, unspecified: Secondary | ICD-10-CM

## 2023-09-11 LAB — BASIC METABOLIC PANEL
Anion gap: 17 — ABNORMAL HIGH (ref 5–15)
BUN: 17 mg/dL (ref 8–23)
CO2: 19 mmol/L — ABNORMAL LOW (ref 22–32)
Calcium: 9.5 mg/dL (ref 8.9–10.3)
Chloride: 102 mmol/L (ref 98–111)
Creatinine, Ser: 0.55 mg/dL (ref 0.44–1.00)
GFR, Estimated: >60 mL/min (ref >60)
Glucose, Bld: 129 mg/dL — ABNORMAL HIGH (ref 70–99)
Potassium: 4.8 mmol/L (ref 3.5–5.1)
Sodium: 138 mmol/L (ref 135–145)

## 2023-09-11 LAB — ECHOCARDIOGRAM COMPLETE
AR max vel: 2.23 cm2
AV Area VTI: 2.19 cm2
AV Area mean vel: 2.35 cm2
AV Mean grad: 8 mm[Hg]
AV Peak grad: 13.4 mm[Hg]
Ao pk vel: 1.83 m/s
Area-P 1/2: 2.95 cm2
Height: 60 in
MV VTI: 3.13 cm2
S' Lateral: 3.4 cm
Weight: 3888 [oz_av]

## 2023-09-11 LAB — LIPID PANEL
Cholesterol: 186 mg/dL (ref 0–200)
HDL: 52 mg/dL (ref >40)
LDL Cholesterol: 103 mg/dL — ABNORMAL HIGH (ref 0–99)
Total CHOL/HDL Ratio: 3.6 {ratio}
Triglycerides: 155 mg/dL — ABNORMAL HIGH (ref <150)
VLDL: 31 mg/dL (ref 0–40)

## 2023-09-11 LAB — HEMOGLOBIN A1C
Hgb A1c MFr Bld: 6.3 % — ABNORMAL HIGH (ref 4.8–5.6)
Mean Plasma Glucose: 134.11 mg/dL

## 2023-09-11 MED ORDER — IOHEXOL 350 MG/ML SOLN
75.0000 mL | Freq: Once | INTRAVENOUS | Status: AC | PRN
  Administered 2023-09-11: 75 mL via INTRAVENOUS

## 2023-09-11 MED ORDER — CLOPIDOGREL BISULFATE 75 MG PO TABS: 75.0000 mg | ORAL_TABLET | Freq: Every day | ORAL | 0 refills | Status: AC

## 2023-09-11 MED ORDER — LOSARTAN POTASSIUM 100 MG PO TABS: 100.0000 mg | ORAL_TABLET | Freq: Every day | ORAL | 0 refills | Status: AC

## 2023-09-11 NOTE — Hospital Course (Signed)
Hospital course / significant events: Sandra Hubbard is a 83 y.o. female past medical history of hypertension, CHF, gastroesophageal reflux disease presenting for evaluation of 1 episode of slurred speech on 1 day last week where she felt that her speech was more gibberish and made no sense.   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  She was admitted to the hospital and neurology was consulted for the stroke.  Echo LVEF 60-65%, RV normal, MV and AV normal. Negative bubble study.  CTA head and neck No ELVO, moderate Right Vert V1 segment narrowing - no relation to stroke area.  LDL 103 A1c - 6.3  Stable for discharge to follow outpatient - see A/P below   Consultants:  Neurology   Procedures/Surgeries: None       ASSESSMENT & PLAN:  Acute CVA (cerebrovascular accident)  ASA 81 + plavix 75 for 3 weeks followed by ASA only  Atorvatstatin 80mg  for goal LDL <70 -30 day  cardiac monitor  Follow up outpatient neurology 8-12 weeks. [facility] stroke clinic stroke NP if prefers [place]. If prefers [facility] Neurology - Dr. Sherryll Burger or Malvin Johns    Mild cognitive impairment Followed by neurology, last seen earlier this month, Mini-Mental 27/30  Sleep apnea Continue CPAP   GERD (gastroesophageal reflux disease) History of peptic ulcer disease Continue omeprazole   DDD (degenerative disc disease), lumbar Osteoarthritis with ambulatory dysfunction Continue cane/walker at baseline   Chronic venous insufficiency Continue Lasix   Obesity, Class III, BMI 40-49.9 (morbid obesity) (HCC) Complicating factor to overall prognosis and care   Essential hypertension Continue losartan and atenolol.   Restart HCTZ which was initially held Conitnue home Lasix Long term Goal BP under 130/80

## 2023-09-11 NOTE — ED Notes (Signed)
Lab called to send phlebotomy to collect lipid panel and creatinine.

## 2023-09-11 NOTE — Evaluation (Signed)
Speech Language Pathology Evaluation Patient Details Name: Sandra Hubbard MRN: [id number] DOB: [birth year] Today's Date: 09/11/2023 Time: 8469-6295 SLP Time Calculation (min) (ACUTE ONLY): 22 min  Problem List:  Patient Active Problem List   Diagnosis Date Noted   Acute CVA (cerebrovascular accident) (HCC) 09/10/2023   Ambulatory dysfunction 09/10/2023   Mild cognitive impairment 09/10/2023   GERD (gastroesophageal reflux disease)    Sleep apnea    RLS (restless legs syndrome)    Lymphedema 06/18/2018   Chronic venous insufficiency 03/18/2018   Bilateral lower extremity edema 12/24/2017   Essential hypertension 12/24/2017   Obesity, Class III, BMI 40-49.9 (morbid obesity) (HCC) 12/24/2017   DDD (degenerative disc disease), lumbar 10/27/2016   Past Medical History:  Past Medical History:  Diagnosis Date   Chronic anemia    DJD (degenerative joint disease)    Edema of both legs    External hemorrhoid    Gastric polyps    Gastric ulcer    Gastritis    GERD (gastroesophageal reflux disease)    Hyperlipidemia, mixed    Hypertension    Osteoarthritis of both knees    Ovarian cancer (HCC)    Rhinitis, allergic    RLS (restless legs syndrome)    Sciatica    Sleep apnea    Uterine cancer [facility])    Past Surgical History:  Past Surgical History:  Procedure Laterality Date   ABDOMINAL HYSTERECTOMY     BREAST BIOPSY Left 04/02/2017   2 area STROMAL FIBROSIS. NEGATIVE FOR ATYPIA   BREAST BIOPSY Right 09/19/2018   [place] bx heart marker- papilloma   CHOLECYSTECTOMY     COLONOSCOPY N/A 12/21/2014   Procedure: COLONOSCOPY;  Surgeon: Elnita Maxwell, MD;  Location: [facility] ENDOSCOPY;  Service: Endoscopy;  Laterality: N/A;   COLONOSCOPY WITH PROPOFOL N/A 12/21/2020   Procedure: COLONOSCOPY WITH PROPOFOL;  Surgeon: Toledo, Boykin Nearing, MD;  Location: [facility] ENDOSCOPY;  Service: Gastroenterology;  Laterality: N/A;   DILATION AND CURETTAGE, DIAGNOSTIC / THERAPEUTIC      ESOPHAGOGASTRODUODENOSCOPY N/A 12/21/2014   Procedure: ESOPHAGOGASTRODUODENOSCOPY (EGD);  Surgeon: Elnita Maxwell, MD;  Location: [facility] ENDOSCOPY;  Service: Endoscopy;  Laterality: N/A;   ESOPHAGOGASTRODUODENOSCOPY N/A 01/27/2015   Procedure: ESOPHAGOGASTRODUODENOSCOPY (EGD);  Surgeon: Elnita Maxwell, MD;  Location: [facility] ENDOSCOPY;  Service: Endoscopy;  Laterality: N/A;   ESOPHAGOGASTRODUODENOSCOPY (EGD) WITH PROPOFOL N/A 06/07/2017   Procedure: ESOPHAGOGASTRODUODENOSCOPY (EGD) WITH PROPOFOL;  Surgeon: Toledo, Boykin Nearing, MD;  Location: [facility] ENDOSCOPY;  Service: Gastroenterology;  Laterality: N/A;   EYE SURGERY     cataract    HIATAL HERNIA REPAIR     TONSILLECTOMY     HPI:  83 y.o. female past medical history of hypertension, CHF, gastroesophageal reflux disease presenting for evaluation of 1 episode of slurred speech on 1 day last week where she felt that her speech was more gibberish and made no sense.  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  She was admitted to the hospital and neurology was consulted for the stroke.   Assessment / Plan / Recommendation Clinical Impression  Pt seen for cognitive-linguistic assessment which was completed via informal means. Pt with documented hx of memory deficits / MCI. Pt taking Aricept PTA. Pt endorsed that she was at / near cognitive-linguistic baseline. Pt demonstrated intact basic speech/language ability. Short term memory deficits apparent for recall of date. No f/u ST recommended at this time for this acute  event. SLP to sign off.    SLP Assessment  SLP Recommendation/Assessment: Patient does not need any further Speech Lanaguage Pathology Services    Recommendations for follow up therapy are one component of a multi-disciplinary discharge planning process, led by the attending physician.  Recommendations may  be updated based on patient status, additional functional criteria and insurance authorization.    Follow Up Recommendations  No SLP follow up    Assistance Recommended at Discharge  Intermittent Supervision/Assistance  Functional Status Assessment Patient has not had a recent decline in their functional status        SLP Evaluation Cognition  Overall Cognitive Status: History of cognitive impairments - at baseline       Comprehension  Auditory Comprehension Overall Auditory Comprehension: Appears within functional limits for tasks assessed    Expression Expression Primary Mode of Expression: Verbal Verbal Expression Overall Verbal Expression: Appears within functional limits for tasks assessed   Oral / Motor  Oral Motor/Sensory Function Overall Oral Motor/Sensory Function: Within functional limits Motor Speech Overall Motor Speech: Appears within functional limits for tasks assessed           Clyde Canterbury, M.S., CCC-SLP Speech-Language Pathologist [facility] [phone] Arnette Felts)  Woodroe Chen 09/11/2023, 1:46 PM

## 2023-09-11 NOTE — Progress Notes (Signed)
PHARMACIST - PHYSICIAN COMMUNICATION  CONCERNING:  Enoxaparin (Lovenox) for DVT Prophylaxis    RECOMMENDATION: Patient was prescribed enoxaprin 40mg  q24 hours for VTE prophylaxis.   Filed Weights   09/10/23 1731  Weight: 110.2 kg (243 lb)    Body mass index is 47.46 kg/m.  Estimated Creatinine Clearance: 61.1 mL/min (by C-G formula based on SCr of 0.55 mg/dL).   Based on [facility] policy patient is candidate for enoxaparin 0.5mg /kg TBW SQ every 24 hours based on BMI being >30.   DESCRIPTION: Pharmacy has adjusted enoxaparin dose per [facility] policy.  Patient is now receiving enoxaparin 55 mg every 24 hours    Barrie Folk, PharmD Clinical Pharmacist  09/11/2023 7:49 AM

## 2023-09-11 NOTE — ED Notes (Signed)
CCMD called to initiate cardiac central monitoring

## 2023-09-11 NOTE — Progress Notes (Signed)
SLP Cancellation Note  Patient Details Name: SARAIYA BEAGAN MRN: [id number] DOB: [birth year]   Cancelled treatment:       Reason Eval/Treat Not Completed: Patient at procedure or test/unavailable (Pt undergoing echocardiogram. Will continue efforts as appropriate.)  Clyde Canterbury, M.S., CCC-SLP Speech-Language Pathologist [facility] [phone] Arnette Felts)  Woodroe Chen 09/11/2023, 8:52 AM

## 2023-09-11 NOTE — ED Notes (Signed)
Pt  ambulatory to hallway restroom (near ED 52) with this NT.

## 2023-09-11 NOTE — ED Notes (Signed)
Pt ambulatory to bedside toliet with cane.

## 2023-09-11 NOTE — Consult Note (Addendum)
NEUROLOGY CONSULT NOTE   Date of service: September 11, 2023 Patient Name: Sandra Hubbard MRN:  [id number] DOB:  Apr 19, 1941 Chief Complaint: "Slurred speech, stroke on outpatient MRI" Requesting Provider: Sunnie Nielsen, DO  History of Present Illness  Sandra Hubbard is a 83 y.o. female past medical history of hypertension, CHF, gastroesophageal reflux disease presenting for evaluation of 1 episode of slurred speech on 1 day last week where she felt that her speech was more gibberish and made no sense.  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  She was admitted to the hospital and neurology was consulted for the stroke. Denies prior history of strokes. Denies tingling numbness weakness or headache. Reports visual changes that are chronic but not reported any visual changes in 1 eye sudden loss or visual field loss or cuts. Denies any changes in ambulatory status.   LKW: Over a week ago Modified rankin score: 2-Slight disability-UNABLE to perform all activities but does not need assistance IV Thrombolysis: Outside the window EVT: Outside the window NIH stroke scale-0  ROS  Comprehensive ROS performed and pertinent positives documented in HPI    Past History   Past Medical History:  Diagnosis Date   Chronic anemia    DJD (degenerative joint disease)    Edema of both legs    External hemorrhoid    Gastric polyps    Gastric ulcer    Gastritis    GERD (gastroesophageal reflux disease)    Hyperlipidemia, mixed    Hypertension    Osteoarthritis of both knees    Ovarian cancer (HCC)    Rhinitis, allergic    RLS (restless legs syndrome)    Sciatica    Sleep apnea    Uterine cancer [facility])     Past Surgical History:  Procedure Laterality Date   ABDOMINAL HYSTERECTOMY     BREAST BIOPSY Left 04/02/2017   2 area STROMAL FIBROSIS. NEGATIVE  FOR ATYPIA   BREAST BIOPSY Right 09/19/2018   [place] bx heart marker- papilloma   CHOLECYSTECTOMY     COLONOSCOPY N/A 12/21/2014   Procedure: COLONOSCOPY;  Surgeon: Elnita Maxwell, MD;  Location: [facility] ENDOSCOPY;  Service: Endoscopy;  Laterality: N/A;   COLONOSCOPY WITH PROPOFOL N/A 12/21/2020   Procedure: COLONOSCOPY WITH PROPOFOL;  Surgeon: Toledo, Boykin Nearing, MD;  Location: [facility] ENDOSCOPY;  Service: Gastroenterology;  Laterality: N/A;   DILATION AND CURETTAGE, DIAGNOSTIC / THERAPEUTIC     ESOPHAGOGASTRODUODENOSCOPY N/A 12/21/2014   Procedure: ESOPHAGOGASTRODUODENOSCOPY (EGD);  Surgeon: Elnita Maxwell, MD;  Location: [facility] ENDOSCOPY;  Service: Endoscopy;  Laterality: N/A;   ESOPHAGOGASTRODUODENOSCOPY N/A 01/27/2015   Procedure: ESOPHAGOGASTRODUODENOSCOPY (EGD);  Surgeon: Elnita Maxwell, MD;  Location: [facility] ENDOSCOPY;  Service: Endoscopy;  Laterality: N/A;   ESOPHAGOGASTRODUODENOSCOPY (EGD) WITH PROPOFOL N/A 06/07/2017   Procedure: ESOPHAGOGASTRODUODENOSCOPY (EGD) WITH PROPOFOL;  Surgeon: Toledo, Boykin Nearing, MD;  Location: [facility] ENDOSCOPY;  Service: Gastroenterology;  Laterality: N/A;   EYE SURGERY     cataract    HIATAL HERNIA REPAIR     TONSILLECTOMY      Family History: Family History  Problem Relation Age of Onset   Breast cancer Neg Hx     Social History  reports that she has never smoked. She has never used smokeless tobacco. She reports that she does not drink alcohol and does not use drugs.  Allergies  Allergen Reactions   Maxzide [Hydrochlorothiazide W-Triamterene] Other (See  Comments)    unk   Donnatal [Pb-Hyoscy-Atropine-Scopolamine] Itching and Rash    Medications   Current Facility-Administered Medications:     stroke: early stages of recovery book, , Does not apply, Once, Andris Baumann, MD   acetaminophen (TYLENOL) tablet 650 mg, 650 mg, Oral, Q4H PRN **OR** acetaminophen (TYLENOL) 160 MG/5ML solution 650 mg, 650 mg, Per Tube, Q4H PRN **OR** acetaminophen  (TYLENOL) suppository 650 mg, 650 mg, Rectal, Q4H PRN, Andris Baumann, MD   aspirin EC tablet 81 mg, 81 mg, Oral, Daily, Andris Baumann, MD, 81 mg at 09/11/23 0012   atenolol (TENORMIN) tablet 50 mg, 50 mg, Oral, Daily, Andris Baumann, MD   clopidogrel (PLAVIX) tablet 75 mg, 75 mg, Oral, Daily, Lindajo Royal V, MD, 75 mg at 09/11/23 0012   enoxaparin (LOVENOX) injection 55 mg, 55 mg, Subcutaneous, Q24H, Belue, Nathan S, RPH   HYDROcodone-acetaminophen (NORCO/VICODIN) 5-325 MG per tablet 1-2 tablet, 1-2 tablet, Oral, Q4H PRN, Andris Baumann, MD   losartan (COZAAR) tablet 100 mg, 100 mg, Oral, Daily, Para March, Odetta Pink, MD   pantoprazole (PROTONIX) EC tablet 40 mg, 40 mg, Oral, Daily, Para March, Odetta Pink, MD   potassium chloride (KLOR-CON M) CR tablet 10 mEq, 10 mEq, Oral, Daily, Andris Baumann, MD   torsemide [facility]) tablet 20 mg, 20 mg, Oral, Daily, Andris Baumann, MD  Current Outpatient Medications:    acetaminophen (TYLENOL) 650 MG CR tablet, Take 1,300 mg by mouth every 8 (eight) hours as needed for pain., Disp: , Rfl:    aspirin 81 MG EC tablet, Take 81 mg by mouth daily. (Patient not taking: Reported on 12/21/2020), Disp: , Rfl: 3   atenolol (TENORMIN) 50 MG tablet, Take 50 mg by mouth daily., Disp: , Rfl: 3   calcium carbonate (OS-CAL) 1250 (500 Ca) MG chewable tablet, Chew 1 tablet by mouth daily., Disp: , Rfl:    cetirizine (ZYRTEC) 10 MG tablet, Take 10 mg by mouth daily., Disp: , Rfl:    Cholecalciferol (VITAMIN D) 2000 UNITS CAPS, Take 1 capsule by mouth 2 (two) times daily., Disp: , Rfl:    co-enzyme Q-10 30 MG capsule, Take 30 mg by mouth daily. , Disp: , Rfl:    diclofenac Sodium (VOLTAREN) 1 % GEL, Apply topically 4 (four) times daily., Disp: , Rfl:    ferrous sulfate 325 (65 FE) MG tablet, Take 325 mg by mouth daily with breakfast., Disp: , Rfl:    fexofenadine (ALLEGRA) 180 MG tablet, Take 180 mg by mouth daily., Disp: , Rfl:    fluticasone (FLONASE) 50 MCG/ACT nasal spray,  Place 2 sprays into both nostrils daily., Disp: , Rfl: 12   furosemide (LASIX) 40 MG tablet, Take 40 mg by mouth daily., Disp: , Rfl:    glucosamine-chondroitin 500-400 MG tablet, Take 2 tablets by mouth 2 (two) times daily., Disp: , Rfl:    hydrochlorothiazide (HYDRODIURIL) 12.5 MG tablet, Take 12.5 mg by mouth daily., Disp: , Rfl:    KLOR-CON 10 10 MEQ tablet, Take 10 mEq by mouth daily., Disp: , Rfl: 3   lisinopril-hydrochlorothiazide (PRINZIDE,ZESTORETIC) 20-12.5 MG per tablet, Take 2 tablets by mouth daily., Disp: , Rfl: 3   Lorcaserin HCl 10 MG TABS, Take 10 mg by mouth., Disp: , Rfl:    losartan (COZAAR) 100 MG tablet, Take 100 mg by mouth daily., Disp: , Rfl:    magnesium oxide (MAG-OX) 400 MG tablet, Take 400 mg by mouth 2 (two) times daily., Disp: , Rfl:  Misc Natural Products (LEG VEIN & CIRCULATION PO), Take 1 tablet by mouth 2 (two) times daily., Disp: , Rfl:    Multiple Vitamins-Minerals (MULTIVITAMIN WITH MINERALS) tablet, Take 2-3 tablets by mouth 2 (two) times daily. 3 tab in the morning and 2 in the afternoon, Disp: , Rfl:    olopatadine (PATANOL) 0.1 % ophthalmic solution, 1 drop 2 (two) times daily., Disp: , Rfl:    Omega-3 Fatty Acids (OMEGA III EPA+DHA PO), Take 1 tablet by mouth 2 (two) times daily., Disp: , Rfl:    omeprazole (PRILOSEC) 40 MG capsule, Take 40 mg by mouth daily., Disp: , Rfl:    potassium chloride (K-DUR,KLOR-CON) 10 MEQ tablet, Take 10 mEq by mouth daily., Disp: , Rfl:    SUPREP BOWEL PREP SOLN, Take 1 each by mouth once., Disp: , Rfl: 0   traMADol (ULTRAM) 50 MG tablet, Take by mouth every 6 (six) hours as needed., Disp: , Rfl:   Vitals   Vitals:   09/11/23 0400 09/11/23 0500 09/11/23 0623 09/11/23 0700  BP: (!) 135/53 (!) 147/67  (!) 144/61  Pulse: 73 86 75 73  Resp: 15 18  16   Temp:      TempSrc:      SpO2: 99% 99% 98% 98%  Weight:      Height:        Body mass index is 47.46 kg/m.  Physical Exam  General: Awake alert in no  distress HEENT: Normocephalic atraumatic Lungs: Clear Cardiovascular: Regular rhythm Abdomen nondistended nontender Neurological exam Awake alert oriented x 3.  No dysarthria.  No evidence of aphasia. Cranial nerve II to XII intact Motor examination with no drift in any of the 4 extremities Sensation intact light touch without extinction Coordination examination with no evidence of ataxia or dysmetria. Gait testing deferred   Labs/Imaging/Neurodiagnostic studies   CBC:  Recent Labs  Lab 19-Sep-2023 1733  WBC 5.5  NEUTROABS 3.9  HGB 12.8  HCT 39.0  MCV 88.4  PLT 439*   Basic Metabolic Panel:  Lab Results  Component Value Date   NA 138 19-Sep-2023   K 4.8 September 19, 2023   CO2 19 (L) September 19, 2023   GLUCOSE 129 (H) 09-19-2023   BUN 17 2023/09/19   CREATININE 0.55 Sep 19, 2023   CALCIUM 9.5 2023/09/19   GFRNONAA >60 Sep 19, 2023   Lipid Panel: No results found for: "LDLCALC" HgbA1c:  Lab Results  Component Value Date   HGBA1C 6.3 (H) 09/19/23   INR  Lab Results  Component Value Date   INR 1.0 19-Sep-2023   MRI Brain(Personally reviewed): Punctate late acute/early subacute stroke in the right insula.  Chronic small vessel disease.  ASSESSMENT   Sandra Hubbard is a 83 y.o. female with above past medical history presenting for evaluation of slurred speech, which was evaluated as an outpatient and an MRI done that showed a punctate late acute/early subacute infarct within the right insula.  Small vessel etiology versus underlying cardiac rhythm abnormalities causing a cardioembolic source could be the etiology. Needs further workup and evaluation   RECOMMENDATIONS  Admit to hospitalist Freq neurochecks Telemetry CT angiography head and neck 2D echo A1c-less than 7 Lipid panel-goal LDL less than 70 On aspirin 81 at home daily.  Add Plavix 75-for 3 weeks.  If the CT angiography head and neck shows intracranial atherosclerotic disease, might extend that for 3 months-pending  CTA. No need for permissive hypertension-goal blood pressure normotension. PT OT Speech therapy Will follow Plan discussed with Dr. Lyn Hollingshead and the patient.  ______________________________________________________________________    Dene Gentry, MD [facility]   ADDENDUM  Echo LVEF 60-65%, RV normal, MV and AV normal. Negative bubble study  CTA head and neck No ELVO, moderate Right Vert V1 segment narrowing - no relation to stroke area.  LDL 103  A1c - 6.3  Final recs -ASA 81 + plavix 75 for 3 weeks followed by ASA only -Atorvatstatin 80mg  for goal LDL <70 -30 day cardiac monitor -Follow up outpatient neurology 8-12 weeks. [facility] stroke clinic stroke NP if prefers [place]. If prefers [facility] Neurology - Dr. Sherryll Burger or Malvin Johns  -- Milon Dikes, MD Neurologist [facility]

## 2023-09-11 NOTE — Discharge Summary (Signed)
Physician Discharge Summary   Patient: Sandra Hubbard MRN: [id number]  DOB: 1940/12/28   Admit:     Date of Admission: 09/10/2023 Admitted from: home   Discharge: Date of discharge: 09/11/23 Disposition: Home Condition at discharge: good  CODE STATUS: FULL CODE     Discharge Physician: Sunnie Nielsen, DO [facility]     PCP: Lauro Regulus, MD  Recommendations for Outpatient Follow-up:  Follow up with PCP Lauro Regulus, MD in 1-2 weeks Follow up with neurology at [facility] clinic 8-12 weeks, sooner as needed  Discharge Instructions     Diet - low sodium heart healthy   Complete by: As directed    Increase activity slowly   Complete by: As directed          Discharge Diagnoses: Principal Problem:   Acute CVA (cerebrovascular accident) [facility]) Active Problems:   Essential hypertension   Obesity, Class III, BMI 40-49.9 (morbid obesity) (HCC)   Chronic venous insufficiency   DDD (degenerative disc disease), lumbar   GERD (gastroesophageal reflux disease)   Sleep apnea   RLS (restless legs syndrome)   Ambulatory dysfunction   Mild cognitive impairment       Hospital Course: Hospital course / significant events: Sandra Hubbard is a 83 y.o. female past medical history of hypertension, CHF, gastroesophageal reflux disease presenting for evaluation of 1 episode of slurred speech on 1 day last week where she felt that her speech was more gibberish and made no sense.   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  She was admitted to the hospital and neurology was consulted for the stroke.  Echo LVEF 60-65%, RV normal, MV and AV normal. Negative bubble study.  CTA head and neck No ELVO, moderate Right Vert V1 segment narrowing - no relation to stroke area.  LDL 103 A1c - 6.3  Stable for discharge to follow  outpatient - see A/P below   Consultants:  Neurology   Procedures/Surgeries: None       ASSESSMENT & PLAN:  Acute CVA (cerebrovascular accident)  ASA 81 + plavix 75 for 3 weeks followed by ASA only  Atorvatstatin 80mg  for goal LDL <70 -30 day  cardiac monitor  Follow up outpatient neurology 8-12 weeks   Mild cognitive impairment Followed by neurology, last seen earlier this month, Mini-Mental 27/30  Sleep apnea Continue CPAP   GERD (gastroesophageal reflux disease) History of peptic ulcer disease Continue omeprazole   DDD (degenerative disc disease), lumbar Osteoarthritis with ambulatory dysfunction Continue cane/walker at baseline   Chronic venous insufficiency Continue Lasix   Obesity, Class III, BMI 40-49.9 (morbid obesity) (HCC) Complicating factor to overall prognosis and care   Essential hypertension Continue losartan and atenolol.   Pt no longer on diuretics at home - ok to hold/dc these  Follow outpatient  Long term Goal BP under 130/80           Discharge Instructions  Allergies as of 09/11/2023       Reactions   Maxzide [hydrochlorothiazide W-triamterene] Other (See Comments)   unk   Donnatal [pb-hyoscy-atropine-scopolamine] Itching, Rash        Medication List     STOP taking these medications    buPROPion 75 MG tablet Commonly known as: WELLBUTRIN   fexofenadine 180 MG tablet Commonly known as: ALLEGRA   furosemide 40 MG tablet Commonly known as: LASIX  hydrochlorothiazide 12.5 MG tablet Commonly known as: HYDRODIURIL   lisinopril-hydrochlorothiazide 20-12.5 MG tablet Commonly known as: ZESTORETIC   Lorcaserin HCl 10 MG Tabs   potassium chloride 10 MEQ tablet Commonly known as: KLOR-CON M   Suprep Bowel Prep Kit 17.5-3.13-1.6 GM/177ML Soln Generic drug: Na Sulfate-K Sulfate-Mg Sulfate concentrate       TAKE these medications    acetaminophen 650 MG CR tablet Commonly known as: TYLENOL Take 1,300 mg by  mouth every 8 (eight) hours as needed for pain.   aspirin EC 81 MG tablet Take 81 mg by mouth daily.   atenolol 50 MG tablet Commonly known as: TENORMIN Take 50 mg by mouth daily.   calcium carbonate 1250 (500 Ca) MG chewable tablet Commonly known as: OS-CAL Chew 1 tablet by mouth daily.   cetirizine 10 MG tablet Commonly known as: ZYRTEC Take 10 mg by mouth daily.   clopidogrel 75 MG tablet Commonly known as: PLAVIX Take 1 tablet (75 mg total) by mouth daily for 7 days. (7 days sent to local pharmacy, 14 days ssent to [facility]) Start taking on: September 12, 2023   clopidogrel 75 MG tablet Commonly known as: Plavix Take 1 tablet (75 mg total) by mouth daily for 14 days. (7 days sent to local pharmacy, 14 days ssent to [facility]) Start taking on: September 18, 2023   co-enzyme Q-10 30 MG capsule Take 30 mg by mouth daily.   donepezil 5 MG tablet Commonly known as: ARICEPT Take 5 mg by mouth at bedtime.   ferrous sulfate 325 (65 FE) MG tablet Take 325 mg by mouth daily with breakfast.   fluticasone 50 MCG/ACT nasal spray Commonly known as: FLONASE Place 2 sprays into both nostrils daily.   gabapentin 300 MG capsule Commonly known as: NEURONTIN Take 1 capsule by mouth at bedtime.   glucosamine-chondroitin 500-400 MG tablet Take 2 tablets by mouth 2 (two) times daily.   Klor-Con 10 10 MEQ tablet Generic drug: potassium chloride Take 10 mEq by mouth daily.   LEG VEIN & CIRCULATION PO Take 1 tablet by mouth 2 (two) times daily.   losartan 100 MG tablet Commonly known as: COZAAR Take 1 tablet (100 mg total) by mouth daily.   magnesium oxide 400 MG tablet Commonly known as: MAG-OX Take 400 mg by mouth 2 (two) times daily.   multivitamin with minerals tablet Take 2-3 tablets by mouth 2 (two) times daily. 3 tab in the morning and 2 in the afternoon   olopatadine 0.1 % ophthalmic solution Commonly known as: PATANOL 1 drop 2 (two) times daily.    OMEGA III EPA+DHA PO Take 1 tablet by mouth 2 (two) times daily.   omeprazole 40 MG capsule Commonly known as: PRILOSEC Take 40 mg by mouth daily.   torsemide 20 MG tablet Commonly known as: DEMADEX Take 20 mg by mouth daily.   traMADol 50 MG tablet Commonly known as: ULTRAM Take by mouth every 6 (six) hours as needed.   Vitamin D 50 MCG (2000 UT) Caps Take 1 capsule by mouth 2 (two) times daily.   Voltaren 1 % Gel Generic drug: diclofenac Sodium Apply topically 4 (four) times daily.         Follow-up Information     Bridgette Habermann, PA-C. Schedule an appointment as soon as possible for a visit.   Specialty: Physician Assistant Why: hosptial follow up - appt in 8-12 weeks Contact information: [address] [place] [address] [phone]  Allergies  Allergen Reactions   Maxzide [Hydrochlorothiazide W-Triamterene] Other (See Comments)    unk   Donnatal [Pb-Hyoscy-Atropine-Scopolamine] Itching and Rash     Subjective: pt reports feeling fine, no problems this morning, eager for discharge home. Denies CP/SOB, N/V/D/C, pain, weakness   Discharge Exam: BP (!) 187/68   Pulse 77   Temp 98.2 F (36.8 C) (Oral)   Resp 16   Ht 5' (1.524 m)   Wt 110.2 kg   SpO2 100%   BMI 47.46 kg/m  General: Pt is alert, awake, not in acute distress Cardiovascular: RRR, S1/S2 +, no rubs, no gallops Respiratory: CTA bilaterally, no wheezing, no rhonchi Abdominal: Soft, NT, ND, bowel sounds + Extremities: no edema, no cyanosis     The results of significant diagnostics from this hospitalization (including imaging, microbiology, ancillary and laboratory) are listed below for reference.     Microbiology: No results found for this or any previous visit (from the past 240 hours).   Labs: BNP (last 3 results) Recent Labs    08/17/23 2032  BNP 276.2*   Basic Metabolic Panel: Recent Labs  Lab 09/10/23 1733  NA 138  K 4.8  CL 102   CO2 19*  GLUCOSE 129*  BUN 17  CREATININE 0.55  CALCIUM 9.5   Liver Function Tests: No results for input(s): "AST", "ALT", "ALKPHOS", "BILITOT", "PROT", "ALBUMIN" in the last 168 hours. No results for input(s): "LIPASE", "AMYLASE" in the last 168 hours. No results for input(s): "AMMONIA" in the last 168 hours. CBC: Recent Labs  Lab 09/10/23 1733  WBC 5.5  NEUTROABS 3.9  HGB 12.8  HCT 39.0  MCV 88.4  PLT 439*   Cardiac Enzymes: No results for input(s): "CKTOTAL", "CKMB", "CKMBINDEX", "TROPONINI" in the last 168 hours. BNP: Invalid input(s): "POCBNP" CBG: No results for input(s): "GLUCAP" in the last 168 hours. D-Dimer No results for input(s): "DDIMER" in the last 72 hours. Hgb A1c Recent Labs    09/10/23 1734  HGBA1C 6.3*   Lipid Profile Recent Labs    09/11/23 0731  CHOL 186  HDL 52  LDLCALC 103*  TRIG 155*  CHOLHDL 3.6   Thyroid function studies No results for input(s): "TSH", "T4TOTAL", "T3FREE", "THYROIDAB" in the last 72 hours.  Invalid input(s): "FREET3" Anemia work up No results for input(s): "VITAMINB12", "FOLATE", "FERRITIN", "TIBC", "IRON", "RETICCTPCT" in the last 72 hours. Urinalysis No results found for: "COLORURINE", "APPEARANCEUR", "LABSPEC", "PHURINE", "GLUCOSEU", "HGBUR", "BILIRUBINUR", "KETONESUR", "PROTEINUR", "UROBILINOGEN", "NITRITE", "LEUKOCYTESUR" Sepsis Labs Recent Labs  Lab 09/10/23 1733  WBC 5.5   Microbiology No results found for this or any previous visit (from the past 240 hours). Imaging ECHOCARDIOGRAM COMPLETE Result Date: 09/11/2023    ECHOCARDIOGRAM REPORT   Patient Name:   Sandra Hubbard Kilts Date of Exam: 09/11/2023 Medical Rec #:  [id number]       Height:       60.0 in Accession #:    [id number]      Weight:       243.0 lb Date of Birth:  29-Mar-1941      BSA:          2.028 m Patient Age:    82 years        BP:           144/61 mmHg Patient Gender: F               HR:           73 bpm. Exam Location:  [facility] Procedure:  2D  Echo, Cardiac Doppler, Color Doppler and Saline Contrast Bubble            Study Indications:     Stroke I63.9  History:         Patient has no prior history of Echocardiogram examinations.                  Risk Factors:Hypertension, Dyslipidemia and Sleep Apnea.  Sonographer:     Cristela Blue Referring Phys:  [id number] Andris Baumann Diagnosing Phys: Julien Nordmann MD IMPRESSIONS  1. Left ventricular ejection fraction, by estimation, is 60 to 65%. The left ventricle has normal function. The left ventricle has no regional wall motion abnormalities. Left ventricular diastolic parameters are consistent with Grade I diastolic dysfunction (impaired relaxation).  2. Right ventricular systolic function is normal. The right ventricular size is normal. There is normal pulmonary artery systolic pressure. The estimated right ventricular systolic pressure is 19.6 mmHg.  3. The mitral valve is normal in structure. Mild mitral valve regurgitation. No evidence of mitral stenosis.  4. The aortic valve is normal in structure. Aortic valve regurgitation is not visualized. No aortic stenosis is present.  5. The inferior vena cava is normal in size with greater than 50% respiratory variability, suggesting right atrial pressure of 3 mmHg.  6. Agitated saline contrast bubble study was negative, with no evidence of any interatrial shunt. FINDINGS  Left Ventricle: Left ventricular ejection fraction, by estimation, is 60 to 65%. The left ventricle has normal function. The left ventricle has no regional wall motion abnormalities. The left ventricular internal cavity size was normal in size. There is  no left ventricular hypertrophy. Left ventricular diastolic parameters are consistent with Grade I diastolic dysfunction (impaired relaxation). Right Ventricle: The right ventricular size is normal. No increase in right ventricular wall thickness. Right ventricular systolic function is normal. There is normal pulmonary artery systolic pressure. The  tricuspid regurgitant velocity is 1.91 m/s, and  with an assumed right atrial pressure of 5 mmHg, the estimated right ventricular systolic pressure is 19.6 mmHg. Left Atrium: Left atrial size was normal in size. Right Atrium: Right atrial size was normal in size. Pericardium: There is no evidence of pericardial effusion. Mitral Valve: The mitral valve is normal in structure. Mild mitral valve regurgitation. No evidence of mitral valve stenosis. MV peak gradient, 5.3 mmHg. The mean mitral valve gradient is 2.0 mmHg. Tricuspid Valve: The tricuspid valve is normal in structure. Tricuspid valve regurgitation is not demonstrated. No evidence of tricuspid stenosis. Aortic Valve: The aortic valve is normal in structure. Aortic valve regurgitation is not visualized. No aortic stenosis is present. Aortic valve mean gradient measures 8.0 mmHg. Aortic valve peak gradient measures 13.4 mmHg. Aortic valve area, by VTI measures 2.19 cm. Pulmonic Valve: The pulmonic valve was normal in structure. Pulmonic valve regurgitation is not visualized. No evidence of pulmonic stenosis. Aorta: The aortic root is normal in size and structure. Venous: The inferior vena cava is normal in size with greater than 50% respiratory variability, suggesting right atrial pressure of 3 mmHg. IAS/Shunts: No atrial level shunt detected by color flow Doppler. Agitated saline contrast was given intravenously to evaluate for intracardiac shunting. Agitated saline contrast bubble study was negative, with no evidence of any interatrial shunt. There  is no evidence of a patent foramen ovale. There is no evidence of an atrial septal defect.  LEFT VENTRICLE PLAX 2D LVIDd:         4.80 cm   Diastology LVIDs:  3.40 cm   LV e' medial:    9.14 cm/s LV PW:         1.00 cm   LV E/e' medial:  8.7 LV IVS:        1.10 cm   LV e' lateral:   12.50 cm/s LVOT diam:     2.00 cm   LV E/e' lateral: 6.3 LV SV:         90 LV SV Index:   44 LVOT Area:     3.14 cm  RIGHT  VENTRICLE RV Basal diam:  3.10 cm RV Mid diam:    2.90 cm LEFT ATRIUM             Index        RIGHT ATRIUM           Index LA diam:        3.20 cm 1.58 cm/m   RA Area:     21.80 cm LA Vol (A2C):   45.9 ml 22.64 ml/m  RA Volume:   59.10 ml  29.15 ml/m LA Vol (A4C):   46.7 ml 23.03 ml/m LA Biplane Vol: 46.8 ml 23.08 ml/m  AORTIC VALVE AV Area (Vmax):    2.23 cm AV Area (Vmean):   2.35 cm AV Area (VTI):     2.19 cm AV Vmax:           183.00 cm/s AV Vmean:          129.000 cm/s AV VTI:            0.409 m AV Peak Grad:      13.4 mmHg AV Mean Grad:      8.0 mmHg LVOT Vmax:         130.00 cm/s LVOT Vmean:        96.500 cm/s LVOT VTI:          0.285 m LVOT/AV VTI ratio: 0.70  AORTA Ao Root diam: 2.90 cm MITRAL VALVE                TRICUSPID VALVE MV Area (PHT): 2.95 cm     TR Peak grad:   14.6 mmHg MV Area VTI:   3.13 cm     TR Vmax:        191.00 cm/s MV Peak grad:  5.3 mmHg MV Mean grad:  2.0 mmHg     SHUNTS MV Vmax:       1.15 m/s     Systemic VTI:  0.29 m MV Vmean:      74.4 cm/s    Systemic Diam: 2.00 cm MV Decel Time: 257 msec MV E velocity: 79.10 cm/s MV A velocity: 100.00 cm/s MV E/A ratio:  0.79 Julien Nordmann MD Electronically signed by Julien Nordmann MD Signature Date/Time: 09/11/2023/10:41:30 AM    Final    CT ANGIO HEAD NECK W WO CM Result Date: 09/11/2023 CLINICAL DATA:  Stroke/TIA, determine embolic source EXAM: CT ANGIOGRAPHY HEAD AND NECK WITH AND WITHOUT CONTRAST TECHNIQUE: Multidetector CT imaging of the head and neck was performed using the standard protocol during bolus administration of intravenous contrast. Multiplanar CT image reconstructions and MIPs were obtained to evaluate the vascular anatomy. Carotid stenosis measurements (when applicable) are obtained utilizing NASCET criteria, using the distal internal carotid diameter as the denominator. RADIATION DOSE REDUCTION: This exam was performed according to the departmental dose-optimization program which includes automated exposure  control, adjustment of the mA and/or kV according to patient size and/or use of iterative reconstruction  technique. CONTRAST:  75mL OMNIPAQUE IOHEXOL 350 MG/ML SOLN COMPARISON:  Brain MRI 09/09/2023 FINDINGS: CT HEAD FINDINGS Brain: No hemorrhage. No hydrocephalus. No extra-axial fluid collection. No mass effect. No mass lesion. No CT evidence of an acute cortical infarct. Punctate infarcts seen on prior brain MRI is not visualized on this exam due to CT technique. Vascular: See below Skull: Normal. Negative for fracture or focal lesion. Sinuses/Orbits: No middle ear or mastoid effusion. Paranasal sinuses are clear. Orbits are notable for bilateral lens replacement, otherwise unremarkable. Other: None. Review of the MIP images confirms the above findings CTA NECK FINDINGS Aortic arch: Standard branching. Imaged portion shows no evidence of aneurysm or dissection. No significant stenosis of the major arch vessel origins. Right carotid system: No evidence of dissection, stenosis (50% or greater), or occlusion. Mild narrowing of the origin of the right ICA secondary to calcified atherosclerotic plaque. Left carotid system: No evidence of dissection, stenosis (50% or greater), or occlusion. Vertebral arteries: Right-dominant. No evidence of dissection or occlusion. Moderate narrowing in the V1 segment of the right vertebral artery (series 11, image 240). Skeleton: Negative. Other neck: Negative. Upper chest: Negative. Review of the MIP images confirms the above findings CTA HEAD FINDINGS Anterior circulation: No significant stenosis, proximal occlusion, aneurysm, or vascular malformation. Posterior circulation: No significant stenosis, proximal occlusion, aneurysm, or vascular malformation. Venous sinuses: As permitted by contrast timing, patent. Anatomic variants: None Review of the MIP images confirms the above findings IMPRESSION: 1. No hemorrhage or CT evidence of an acute cortical infarct. Punctate infarcts seen on  prior brain MRI is not visualized on this exam due to CT technique. 2. No intracranial large vessel occlusion. 3. Moderate narrowing in the V1 segment of the right vertebral artery. Electronically Signed   By: Lorenza Cambridge M.D.   On: 09/11/2023 09:47      Time coordinating discharge: over 30 minutes  SIGNED:  Sunnie Nielsen DO [facility]

## 2023-09-11 NOTE — Evaluation (Signed)
Physical Therapy Evaluation Patient Details Name: Sandra Hubbard MRN: [id number] DOB: 11-Jun-1941 Today's Date: 09/11/2023  History of Present Illness  83 y.o. female past medical history of hypertension, CHF, gastroesophageal reflux disease presenting for evaluation of 1 episode of slurred speech on 1 day last week where she felt that her speech was more gibberish and made no sense.  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  She was admitted to the hospital and neurology was consulted for the stroke.  Clinical Impression  Pt did well with PT exam, she reports feeling close to her baseline and was able to perform bed mobility and ~150 ft of ambulation w/ safety and confidence.  She does have limp with ambulation (reports chronic b/l knee OA pain) that did necessitate increased use of cane, pt also uses rollator at home and this likely would mitigate some of the limp.  Pt eager to go home and safe to do so from a PT standpoint.  Will maintain on caseload to further address post-CVA progression.        If plan is discharge home, recommend the following: A little help with walking and/or transfers;A little help with bathing/dressing/bathroom;Assistance with cooking/housework;Assist for transportation;Help with stairs or ramp for entrance   Can travel by private vehicle        Equipment Recommendations None recommended by PT  Recommendations for Other Services       Functional Status Assessment Patient has had a recent decline in their functional status and demonstrates the ability to make significant improvements in function in a reasonable and predictable amount of time.     Precautions / Restrictions Precautions Precautions: Fall Restrictions Weight Bearing Restrictions Per Provider Order: No      Mobility  Bed Mobility Overal bed mobility:  Modified Independent             General bed mobility comments: Pt up to sitting w/o issue, did struggle with getting LEs back up into bed but eventually did so w/o assist.  Reports she sleeps in lift chair most nights    Transfers Overall transfer level: Modified independent Equipment used: Straight cane               General transfer comment: Pt was able to rise to standing and return to sitting with confidence and safety, no issues    Ambulation/Gait Ambulation/Gait assistance: Supervision Gait Distance (Feet): 150 Feet Assistive device: Rolling walker (2 wheels)         General Gait Details: Pt was able to ambulate well down the hallway with (baseline) limp but consistent and safe cadence.  She and her g-son both report seems close to baseline.  Minimal unsteadiness w/o need for PT intervetion during "prolonged" effort  Stairs            Wheelchair Mobility     Tilt Bed    Modified Rankin (Stroke Patients Only)       Balance Overall balance assessment: Modified Independent                                           Pertinent Vitals/Pain Pain Assessment Pain Assessment:  (chronic OA knee pain)    Home Living Family/patient expects to be discharged to:: Private residence Living Arrangements: Other relatives (grand son)  Available Help at Discharge: Family;Available 24 hours/day   Home Access: Ramped entrance       Home Layout: One level Home Equipment: Agricultural consultant (2 wheels);Rollator (4 wheels);Cane - single point      Prior Function Prior Level of Function : Independent/Modified Independent             Mobility Comments: Pt able to be up in the home ad lib, does endorse that she is sitting/reading (and "being a little lazy") most of the time ADLs Comments: g-son helps as needed, though she is able to manage most of what she needs w/o issue     Extremity/Trunk Assessment   Upper Extremity Assessment Upper  Extremity Assessment: Overall WFL for tasks assessed    Lower Extremity Assessment Lower Extremity Assessment: Overall WFL for tasks assessed (= b/l)       Communication   Communication Communication: No apparent difficulties;Hearing impairment  Cognition Arousal: Alert Behavior During Therapy: WFL for tasks assessed/performed Overall Cognitive Status: History of cognitive impairments - at baseline (minimal baseline confusion, at baseline per pt and g-son)                                 General Comments: pleasant and appropriate t/o the session        General Comments General comments (skin integrity, edema, etc.): Pt reports feeling close to her baseline with no visual, coordination, strength, etc concerns    Exercises     Assessment/Plan    PT Assessment All further PT needs can be met in the next venue of care  PT Problem List Decreased strength;Decreased range of motion;Decreased balance;Decreased activity tolerance;Decreased mobility;Decreased safety awareness;Pain       PT Treatment Interventions      PT Goals (Current goals can be found in the Care Plan section)  Acute Rehab PT Goals Patient Stated Goal: go home today PT Goal Formulation: With patient Time For Goal Achievement: 09/17/23 Potential to Achieve Goals: Good    Frequency       Co-evaluation               AM-PAC PT "6 Clicks" Mobility  Outcome Measure Help needed turning from your back to your side while in a flat bed without using bedrails?: None Help needed moving from lying on your back to sitting on the side of a flat bed without using bedrails?: None Help needed moving to and from a bed to a chair (including a wheelchair)?: A Little Help needed standing up from a chair using your arms (e.g., wheelchair or bedside chair)?: A Little Help needed to walk in hospital room?: A Little Help needed climbing 3-5 steps with a railing? : A Little 6 Click Score: 20    End of  Session Equipment Utilized During Treatment: Gait belt Activity Tolerance: Patient tolerated treatment well Patient left: in bed;with call bell/phone within reach;with family/visitor present Nurse Communication: Mobility status PT Visit Diagnosis: Other symptoms and signs involving the nervous system (R29.898)    Time: 0935-1000 PT Time Calculation (min) (ACUTE ONLY): 25 min   Charges:   PT Evaluation $PT Eval Low Complexity: 1 Low   PT General Charges $$ ACUTE PT VISIT: 1 Visit         Malachi Pro, DPT 09/11/2023, 10:57 AM

## 2023-09-27 ENCOUNTER — Other Ambulatory Visit: Payer: Self-pay

## 2023-10-07 NOTE — Addendum Note (Signed)
 Encounter addended by: Bryna Colander, RN on: 10/07/2023 2:31 PM  Actions taken: Imaging Exam ended

## 2023-11-07 ENCOUNTER — Other Ambulatory Visit: Payer: Self-pay
